# Patient Record
Sex: Male | Born: 1985 | Race: Black or African American | Marital: Married | Smoking: Never smoker
Health system: Southern US, Community
[De-identification: ages and names within clinical notes are randomized; demographics above are authoritative.]

## PROBLEM LIST (undated history)

## (undated) DIAGNOSIS — B1910 Unspecified viral hepatitis B without hepatic coma: Secondary | ICD-10-CM

## (undated) HISTORY — PX: GANGLION CYST EXCISION: SHX1691

## (undated) HISTORY — DX: Unspecified viral hepatitis B without hepatic coma: B19.10

---

## 2019-02-10 ENCOUNTER — Other Ambulatory Visit: Payer: Self-pay

## 2019-02-10 ENCOUNTER — Encounter: Payer: Self-pay | Admitting: Internal Medicine

## 2019-02-10 ENCOUNTER — Ambulatory Visit (INDEPENDENT_AMBULATORY_CARE_PROVIDER_SITE_OTHER): Admitting: Internal Medicine

## 2019-02-10 DIAGNOSIS — B181 Chronic viral hepatitis B without delta-agent: Secondary | ICD-10-CM | POA: Diagnosis not present

## 2019-02-10 DIAGNOSIS — Z227 Latent tuberculosis: Secondary | ICD-10-CM

## 2019-02-10 MED ORDER — RIFAMPIN 300 MG PO CAPS: 600.0000 mg | ORAL_CAPSULE | Freq: Every day | ORAL | 3 refills | Status: DC

## 2019-02-10 NOTE — Progress Notes (Signed)
    [facility]      Reason for Consult:chronic hepatitis B and latent Tb    Referring Physician: Dr. Melford Aase    Patient ID: Sean Brady, male    DOB: Aug 15, 1985, 33 y.o.   MRN: [id number]  HPI:   Here for evaluation of known hepatitis B and recent Quantiferon Gold test positive.   He was diagnosed with chronic hepatitis B in about 2015 after receiving the vaccination series twice without immunity.  He was then found to be surface Ag positive and had a viral load that fluctuated up over 100,000.  Since he was in [employer], he was started on Viread and took for about 1 year.  He then followed up with a Gi doctor in [place] who stopped the Viread and observed him off it.  He did well and labs reviewed show an AST of 31, ALT of 59 and a viral DNA of 9,400.  He recalls an ultrasound but no issues.  No record of a Fibroscan.  He also was recently tested for latent Tb and had a negative CXR (record not available to me at this time).  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  He has not received treatment for this.  He does have some occasional night sweats but no cough, congestion, weight loss.   Previous record reviewed with Quantiferon test and other labs, summarized above.   PMH: chronic hepatititis B, Latent Tb  Prior to Admission medications   Medication Sig Start Date End Date Taking? Authorizing Provider  rifampin (RIFADIN) 300 MG capsule Take 2 capsules (600 mg total) by mouth daily. 02/10/19   Clanton Emanuelson, Okey Regal, MD    Allergies  Allergen Reactions  . Aspirin Anaphylaxis    G6PD deficiency    Social History   Tobacco Use  . Smoking status: Never Smoker  . Smokeless tobacco: Never Used  Substance Use Topics  . Alcohol use: Not on file  . Drug use: Not on file   [facility]: unclear if mother has hepatitis B  Review of Systems  Constitutional: negative for fevers, chills, malaise, anorexia and weight loss Respiratory: negative for cough, sputum or  hemoptysis Integument/breast: negative for rash Musculoskeletal: negative for myalgias and arthralgias All other systems reviewed and are negative    Constitutional: in no apparent distress  Vitals:   02/10/19 1006  BP: 134/90  Pulse: 70  Temp: 98.6 F (37 C)   EYES: anicteric ENMT: no thrush Cardiovascular: Cor RRR Respiratory: CTA B; normal respiratory effort GI: Bowel sounds are normal, liver is not enlarged, spleen is not enlarged Musculoskeletal: no pedal edema noted Skin: negatives: no rash Neuro: non-focal  Assessment: chronic hepatitis B, immune inactive state.  I discussed the natural history of hepatitis B, indications for treatment, lack of cure.  I discussed his previous labs and treatment for him not indicated.  I also discussed the risk of [place] and future needs for screening.  Latent Tb, his risk of active Tb so will treat  Plan: 1) hepatitis B labs 2) ultrasound with elastography 3) rifampin 600 mg daily for 4 months 4) get CXR result 5) follow up in 4 weeks for latent Tb and get CMP then  Thanks for the referral

## 2019-02-14 ENCOUNTER — Ambulatory Visit (HOSPITAL_COMMUNITY)

## 2019-02-18 LAB — CBC
HCT: 44.5 % (ref 38.5–50.0)
Hemoglobin: 15 g/dL (ref 13.2–17.1)
MCH: 29.2 pg (ref 27.0–33.0)
MCHC: 33.7 g/dL (ref 32.0–36.0)
MCV: 86.7 fL (ref 80.0–100.0)
MPV: 11.3 fL (ref 7.5–12.5)
Platelets: 282 10*3/uL (ref 140–400)
RBC: 5.13 10*6/uL (ref 4.20–5.80)
RDW: 10.7 % — ABNORMAL LOW (ref 11.0–15.0)
WBC: 3.8 10*3/uL (ref 3.8–10.8)

## 2019-02-18 LAB — HEPATITIS B SURFACE ANTIGEN: Hepatitis B Surface Ag: REACTIVE — AB

## 2019-02-18 LAB — HEPATITIS DELTA ANTIBODY: Hepatitis D Ab, Total: NEGATIVE

## 2019-02-18 LAB — COMPLETE METABOLIC PANEL WITH GFR
AG Ratio: 1.5 (calc) (ref 1.0–2.5)
ALT: 39 U/L (ref 9–46)
AST: 20 U/L (ref 10–40)
Albumin: 4.4 g/dL (ref 3.6–5.1)
Alkaline phosphatase (APISO): 47 U/L (ref 36–130)
BUN: 13 mg/dL (ref 7–25)
CO2: 30 mmol/L (ref 20–32)
Calcium: 9.7 mg/dL (ref 8.6–10.3)
Chloride: 102 mmol/L (ref 98–110)
Creat: 0.95 mg/dL (ref 0.60–1.35)
GFR, Est African American: 122 mL/min/{1.73_m2} (ref >=60)
GFR, Est Non African American: 105 mL/min/{1.73_m2} (ref >=60)
Globulin: 3 g/dL (calc) (ref 1.9–3.7)
Glucose, Bld: 92 mg/dL (ref 65–99)
Potassium: 4.5 mmol/L (ref 3.5–5.3)
Sodium: 140 mmol/L (ref 135–146)
Total Bilirubin: 1 mg/dL (ref 0.2–1.2)
Total Protein: 7.4 g/dL (ref 6.1–8.1)

## 2019-02-18 LAB — HEPATITIS A ANTIBODY, TOTAL: Hepatitis A AB,Total: REACTIVE — AB

## 2019-02-18 LAB — HEPATITIS C ANTIBODY
Hepatitis C Ab: NONREACTIVE
SIGNAL TO CUT-OFF: 0.03 (ref <1.00)

## 2019-02-18 LAB — HIV ANTIBODY (ROUTINE TESTING W REFLEX): HIV 1&2 Ab, 4th Generation: NONREACTIVE

## 2019-02-18 LAB — HEPATITIS B CORE ANTIBODY, TOTAL: Hep B Core Total Ab: REACTIVE — AB

## 2019-02-18 LAB — HEPATITIS B E ANTIBODY: Hep B E Ab: REACTIVE — AB

## 2019-02-18 LAB — HEPATITIS B SURFACE ANTIBODY,QUALITATIVE: Hep B S Ab: NONREACTIVE

## 2019-02-18 LAB — HEPATITIS B DNA, ULTRAQUANTITATIVE, PCR
Hepatitis B DNA (Calc): 4.41 Log IU/mL — ABNORMAL HIGH
Hepatitis B DNA: 25600 IU/mL — ABNORMAL HIGH

## 2019-02-18 LAB — HEPATITIS B E ANTIGEN: Hep B E Ag: NONREACTIVE

## 2019-03-07 ENCOUNTER — Ambulatory Visit (HOSPITAL_COMMUNITY)

## 2019-03-09 ENCOUNTER — Ambulatory Visit: Admitting: Internal Medicine

## 2019-03-14 ENCOUNTER — Other Ambulatory Visit: Payer: Self-pay

## 2019-03-14 ENCOUNTER — Ambulatory Visit (HOSPITAL_COMMUNITY)
Admission: RE | Admit: 2019-03-14 | Discharge: 2019-03-14 | Disposition: A | Discharge status: Home / Self Care | Source: Ambulatory Visit | Attending: Internal Medicine | Admitting: Internal Medicine

## 2019-03-14 DIAGNOSIS — B181 Chronic viral hepatitis B without delta-agent: Secondary | ICD-10-CM | POA: Diagnosis present

## 2019-03-22 ENCOUNTER — Telehealth: Payer: Self-pay

## 2019-03-22 NOTE — Telephone Encounter (Signed)
COVID-19 Pre-Screening Questions:03/22/19   Do you currently have a fever (>100 F), chills or unexplained body aches?NO  Are you currently experiencing new cough, shortness of breath, sore throat, runny nose? NO  .  Have you recently travelled outside the state of [place] in the last 14 days? NO .  Have you been in contact with someone that is currently pending confirmation of Covid19 testing or has been confirmed to have the Covid19 virus?  NO  **If the patient answers NO to ALL questions -  advise the patient to please call the clinic before coming to the office should any symptoms develop.     

## 2019-03-23 ENCOUNTER — Other Ambulatory Visit: Payer: Self-pay

## 2019-03-23 ENCOUNTER — Encounter: Payer: Self-pay | Admitting: Internal Medicine

## 2019-03-23 ENCOUNTER — Ambulatory Visit (INDEPENDENT_AMBULATORY_CARE_PROVIDER_SITE_OTHER): Admitting: Internal Medicine

## 2019-03-23 VITALS — BP 144/89 | HR 75 | Temp 98.7°F | Ht 68.0 in | Wt 180.0 lb

## 2019-03-23 DIAGNOSIS — Z9189 Other specified personal risk factors, not elsewhere classified: Secondary | ICD-10-CM

## 2019-03-23 DIAGNOSIS — B181 Chronic viral hepatitis B without delta-agent: Secondary | ICD-10-CM

## 2019-03-23 DIAGNOSIS — Z227 Latent tuberculosis: Secondary | ICD-10-CM

## 2019-03-23 NOTE — Assessment & Plan Note (Addendum)
Went over the labs, ultrasound. I discussed the findings of no liver inflammation, no scaring and indications for treatment, which are none at this time.  He is E Ag negative with a DNA level that is high enough so will continue to watch every 6 months.  He has been on Viread before.   Recheck around May

## 2019-03-23 NOTE — Assessment & Plan Note (Signed)
At risk for [facility] though not really active.  Will consider an ultrasound every 6 months

## 2019-03-23 NOTE — Assessment & Plan Note (Signed)
I do feel rifampin is ok with no fibrosis and no transaminitis.  A shorter duration of rifampin is preferable.   He will start tomorrow and follow up in 3 weeks with me and will get a CMP then

## 2019-03-23 NOTE — Progress Notes (Signed)
   Subjective:    Patient ID: Sean Brady, male    DOB: 27-Nov-1985, 33 y.o.   MRN: [id number]  HPI Here for follow up of hepatitis B and latent Tb. Started on rifampin for latent Tb and tolerating.  Plan for 4 months of rifampin. Has previously been on treatment for hepatitis B but not currently.  Viral load of 25,600.  ALT wnl.  Ultrasound with elastography with no fibrosis.   Was hesitant to take rifampin after the health dept called and said with his hepatitis B, rifampin may not be good.     Review of Systems  Constitutional: Negative for fatigue and unexpected weight change.  Gastrointestinal: Negative for diarrhea and nausea.  Skin: Negative for rash.       Objective:   Physical Exam Constitutional:      Appearance: Normal appearance.  Eyes:     General: No scleral icterus. Neurological:     General: No focal deficit present.     Mental Status: He is alert.  Psychiatric:        Mood and Affect: Mood normal.   SH": no alcohol        Assessment & Plan:

## 2019-04-13 ENCOUNTER — Ambulatory Visit (INDEPENDENT_AMBULATORY_CARE_PROVIDER_SITE_OTHER): Admitting: Internal Medicine

## 2019-04-13 ENCOUNTER — Encounter: Payer: Self-pay | Admitting: Internal Medicine

## 2019-04-13 ENCOUNTER — Other Ambulatory Visit: Payer: Self-pay

## 2019-04-13 VITALS — BP 124/85 | HR 74 | Wt 182.8 lb

## 2019-04-13 DIAGNOSIS — Z227 Latent tuberculosis: Secondary | ICD-10-CM | POA: Diagnosis not present

## 2019-04-13 DIAGNOSIS — Z5181 Encounter for therapeutic drug level monitoring: Secondary | ICD-10-CM | POA: Diagnosis not present

## 2019-04-13 LAB — COMPLETE METABOLIC PANEL WITH GFR
AG Ratio: 1.6 (calc) (ref 1.0–2.5)
ALT: 24 U/L (ref 9–46)
AST: 23 U/L (ref 10–40)
Albumin: 4.1 g/dL (ref 3.6–5.1)
Alkaline phosphatase (APISO): 63 U/L (ref 36–130)
BUN: 15 mg/dL (ref 7–25)
CO2: 27 mmol/L (ref 20–32)
Calcium: 9.1 mg/dL (ref 8.6–10.3)
Chloride: 105 mmol/L (ref 98–110)
Creat: 1.03 mg/dL (ref 0.60–1.35)
GFR, Est African American: 111 mL/min/{1.73_m2} (ref >=60)
GFR, Est Non African American: 96 mL/min/{1.73_m2} (ref >=60)
Globulin: 2.5 g/dL (calc) (ref 1.9–3.7)
Glucose, Bld: 87 mg/dL (ref 65–99)
Potassium: 4.3 mmol/L (ref 3.5–5.3)
Sodium: 139 mmol/L (ref 135–146)
Total Bilirubin: 0.7 mg/dL (ref 0.2–1.2)
Total Protein: 6.6 g/dL (ref 6.1–8.1)

## 2019-04-13 NOTE — Assessment & Plan Note (Signed)
Tolerating well.  Will continue for 4 months.

## 2019-04-13 NOTE — Progress Notes (Signed)
   Subjective:    Patient ID: Sean Brady, male    DOB: 03-May-1985, 33 y.o.   MRN: [id number]  HPI Here for follow up of latent Tb Started rifampin for 4 months.  Now on his second bottle.  Now on his second bottle.  No  Issues.  No associated sob, cough.    Review of Systems  Constitutional: Negative for chills, fatigue, fever and unexpected weight change.  Respiratory: Negative for cough and shortness of breath.   Skin: Negative for rash.       Objective:   Physical Exam Constitutional:      Appearance: Normal appearance.  Cardiovascular:     Rate and Rhythm: Normal rate and regular rhythm.  Pulmonary:     Effort: Pulmonary effort is normal. No respiratory distress.     Breath sounds: Normal breath sounds.  Neurological:     General: No focal deficit present.     Mental Status: He is alert.           Assessment & Plan:

## 2019-04-13 NOTE — Assessment & Plan Note (Signed)
Will check CMP today  

## 2019-08-15 ENCOUNTER — Ambulatory Visit (INDEPENDENT_AMBULATORY_CARE_PROVIDER_SITE_OTHER): Admitting: Internal Medicine

## 2019-08-15 ENCOUNTER — Encounter: Payer: Self-pay | Admitting: Internal Medicine

## 2019-08-15 ENCOUNTER — Other Ambulatory Visit: Payer: Self-pay

## 2019-08-15 VITALS — BP 138/75 | HR 75 | Temp 98.0°F | Ht 68.0 in | Wt 176.0 lb

## 2019-08-15 DIAGNOSIS — Z227 Latent tuberculosis: Secondary | ICD-10-CM | POA: Diagnosis not present

## 2019-08-15 DIAGNOSIS — Z5181 Encounter for therapeutic drug level monitoring: Secondary | ICD-10-CM | POA: Diagnosis not present

## 2019-08-15 DIAGNOSIS — B181 Chronic viral hepatitis B without delta-agent: Secondary | ICD-10-CM | POA: Diagnosis not present

## 2019-08-15 DIAGNOSIS — Z9189 Other specified personal risk factors, not elsewhere classified: Secondary | ICD-10-CM

## 2019-08-15 NOTE — Progress Notes (Signed)
   Subjective:    Patient ID: Sean Brady, male    DOB: Oct 06, 1985, 34 y.o.   MRN: [id number]  HPI Here for follow up of latent Tb and chronic hepatitis B. He has now completed 4 months of rifampin for the latent Tb.  No significant issues.   Questions about hepatitis B and when he may have been infected.  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    Review of Systems  Constitutional: Negative for fatigue.  Gastrointestinal: Negative for diarrhea and nausea.  Skin: Negative for rash.       Objective:   Physical Exam Constitutional:      Appearance: Normal appearance.  Eyes:     General: No scleral icterus. Pulmonary:     Effort: Pulmonary effort is normal.  Neurological:     General: No focal deficit present.     Mental Status: He is alert.  Psychiatric:        Mood and Affect: Mood normal.   SH: no alcohol        Assessment & Plan:

## 2019-08-15 NOTE — Assessment & Plan Note (Signed)
No transaminitis and DNA level noted, 25,000.  Will recheck and continue to monitor twice a year.

## 2019-08-15 NOTE — Assessment & Plan Note (Signed)
Will check LFTs after completing rifampin

## 2019-08-15 NOTE — Assessment & Plan Note (Signed)
Will continue to monitor for [facility] screening with ultrasound twice a year.  Indication is black male with chronic hepatitis B.

## 2019-08-15 NOTE — Assessment & Plan Note (Signed)
Completed treatment and tolerated well.

## 2019-08-17 LAB — COMPLETE METABOLIC PANEL WITH GFR
AG Ratio: 1.7 (calc) (ref 1.0–2.5)
ALT: 38 U/L (ref 9–46)
AST: 23 U/L (ref 10–40)
Albumin: 4.5 g/dL (ref 3.6–5.1)
Alkaline phosphatase (APISO): 57 U/L (ref 36–130)
BUN: 12 mg/dL (ref 7–25)
CO2: 30 mmol/L (ref 20–32)
Calcium: 10 mg/dL (ref 8.6–10.3)
Chloride: 103 mmol/L (ref 98–110)
Creat: 1.11 mg/dL (ref 0.60–1.35)
GFR, Est African American: 101 mL/min/{1.73_m2} (ref >=60)
GFR, Est Non African American: 87 mL/min/{1.73_m2} (ref >=60)
Globulin: 2.7 g/dL (calc) (ref 1.9–3.7)
Glucose, Bld: 92 mg/dL (ref 65–99)
Potassium: 4.7 mmol/L (ref 3.5–5.3)
Sodium: 140 mmol/L (ref 135–146)
Total Bilirubin: 0.5 mg/dL (ref 0.2–1.2)
Total Protein: 7.2 g/dL (ref 6.1–8.1)

## 2019-08-17 LAB — HEPATITIS B DNA, ULTRAQUANTITATIVE, PCR
Hepatitis B DNA (Calc): 3.74 Log IU/mL — ABNORMAL HIGH
Hepatitis B DNA: 5510 IU/mL — ABNORMAL HIGH

## 2019-08-17 LAB — HEPATITIS B SURFACE ANTIGEN: Hepatitis B Surface Ag: REACTIVE — AB

## 2019-08-18 ENCOUNTER — Ambulatory Visit
Admission: RE | Admit: 2019-08-18 | Discharge: 2019-08-18 | Disposition: A | Discharge status: Home / Self Care | Source: Ambulatory Visit | Attending: Internal Medicine | Admitting: Internal Medicine

## 2019-08-18 DIAGNOSIS — Z9189 Other specified personal risk factors, not elsewhere classified: Secondary | ICD-10-CM

## 2019-08-18 DIAGNOSIS — B181 Chronic viral hepatitis B without delta-agent: Secondary | ICD-10-CM

## 2019-10-31 ENCOUNTER — Telehealth: Payer: Self-pay

## 2019-10-31 NOTE — Telephone Encounter (Signed)
Patient requesting letter stating he has been effectively treated for latent TB for [facility] school records. Requesting upload to MyChart ASAP. Advised patient that provider is working in the hospital at the moment. Patient verbalized understanding.  Maahir Horst Loyola Mast, RN

## 2019-11-01 ENCOUNTER — Encounter: Payer: Self-pay | Admitting: Internal Medicine

## 2019-11-01 NOTE — Telephone Encounter (Signed)
I uploaded it on the chart and I faxed a signed one to you if he wants to pick it up.  thanks

## 2020-02-01 ENCOUNTER — Ambulatory Visit (INDEPENDENT_AMBULATORY_CARE_PROVIDER_SITE_OTHER): Admitting: Internal Medicine

## 2020-02-01 ENCOUNTER — Other Ambulatory Visit: Payer: Self-pay

## 2020-02-01 ENCOUNTER — Encounter: Payer: Self-pay | Admitting: Internal Medicine

## 2020-02-01 VITALS — BP 115/80 | HR 99 | Temp 99.3°F | Ht 68.0 in | Wt 173.0 lb

## 2020-02-01 DIAGNOSIS — Z9189 Other specified personal risk factors, not elsewhere classified: Secondary | ICD-10-CM

## 2020-02-01 DIAGNOSIS — B181 Chronic viral hepatitis B without delta-agent: Secondary | ICD-10-CM

## 2020-02-02 ENCOUNTER — Encounter: Payer: Self-pay | Admitting: Internal Medicine

## 2020-02-02 NOTE — Assessment & Plan Note (Signed)
He continues to have no concerns on labs with no transaminitis.  His viral load is up for someone that is E Ag negative but elastography reassuring so no indication for treatment.

## 2020-02-02 NOTE — Assessment & Plan Note (Signed)
He will need continued surveillance for [facility] and will schedule for ultrasound.

## 2020-02-02 NOTE — Progress Notes (Signed)
   Subjective:    Patient ID: Sean Brady, male    DOB: 03-16-1986, 34 y.o.   MRN: [id number]  HPI Here for follow up of chronic hepatitis B. He completed 4 months of rifampin for latent Tb.  He has chronic inactive hepatitis B with E Ag negative and elastography with low risk category with kPa of 5.5.  Though his viral load has been up his AST and ALT have remained wnl even with the lower cutoff range for hepatitis B.  Last hepatitis B DNA was 5,510 IU/mL.     Review of Systems  Constitutional: Negative for fatigue.  Gastrointestinal: Negative for diarrhea and nausea.  Skin: Negative for rash.       Objective:   Physical Exam Constitutional:      Appearance: Normal appearance.  Eyes:     General: No scleral icterus. Pulmonary:     Effort: Pulmonary effort is normal.  Neurological:     General: No focal deficit present.     Mental Status: He is alert.  Psychiatric:        Mood and Affect: Mood normal.   SH: no alcohol        Assessment & Plan:

## 2020-02-03 LAB — COMPLETE METABOLIC PANEL WITH GFR
AG Ratio: 1.7 (calc) (ref 1.0–2.5)
ALT: 20 U/L (ref 9–46)
AST: 17 U/L (ref 10–40)
Albumin: 4.7 g/dL (ref 3.6–5.1)
Alkaline phosphatase (APISO): 56 U/L (ref 36–130)
BUN: 10 mg/dL (ref 7–25)
CO2: 29 mmol/L (ref 20–32)
Calcium: 9.9 mg/dL (ref 8.6–10.3)
Chloride: 103 mmol/L (ref 98–110)
Creat: 1.17 mg/dL (ref 0.60–1.35)
GFR, Est African American: 94 mL/min/{1.73_m2} (ref >=60)
GFR, Est Non African American: 81 mL/min/{1.73_m2} (ref >=60)
Globulin: 2.7 g/dL (calc) (ref 1.9–3.7)
Glucose, Bld: 90 mg/dL (ref 65–99)
Potassium: 4 mmol/L (ref 3.5–5.3)
Sodium: 141 mmol/L (ref 135–146)
Total Bilirubin: 0.8 mg/dL (ref 0.2–1.2)
Total Protein: 7.4 g/dL (ref 6.1–8.1)

## 2020-02-03 LAB — HEPATITIS B DNA, ULTRAQUANTITATIVE, PCR
Hepatitis B DNA (Calc): 3.32 Log IU/mL — ABNORMAL HIGH
Hepatitis B DNA: 2100 IU/mL — ABNORMAL HIGH

## 2020-02-08 ENCOUNTER — Ambulatory Visit
Admission: RE | Admit: 2020-02-08 | Discharge: 2020-02-08 | Disposition: A | Discharge status: Home / Self Care | Source: Ambulatory Visit | Attending: Internal Medicine | Admitting: Internal Medicine

## 2020-02-08 DIAGNOSIS — B181 Chronic viral hepatitis B without delta-agent: Secondary | ICD-10-CM

## 2020-02-14 ENCOUNTER — Ambulatory Visit: Admitting: Internal Medicine

## 2020-07-21 ENCOUNTER — Encounter (HOSPITAL_COMMUNITY): Payer: Self-pay | Admitting: Emergency Medicine

## 2020-07-21 ENCOUNTER — Other Ambulatory Visit: Payer: Self-pay

## 2020-07-21 ENCOUNTER — Emergency Department (HOSPITAL_COMMUNITY)
Admission: EM | Admit: 2020-07-21 | Discharge: 2020-07-21 | Disposition: A | Discharge status: Home / Self Care | Attending: Emergency Medicine | Admitting: Emergency Medicine

## 2020-07-21 ENCOUNTER — Emergency Department (HOSPITAL_COMMUNITY)

## 2020-07-21 DIAGNOSIS — Z859 Personal history of malignant neoplasm, unspecified: Secondary | ICD-10-CM | POA: Diagnosis not present

## 2020-07-21 DIAGNOSIS — Z20822 Contact with and (suspected) exposure to covid-19: Secondary | ICD-10-CM | POA: Insufficient documentation

## 2020-07-21 DIAGNOSIS — R0789 Other chest pain: Secondary | ICD-10-CM

## 2020-07-21 DIAGNOSIS — R079 Chest pain, unspecified: Secondary | ICD-10-CM | POA: Insufficient documentation

## 2020-07-21 DIAGNOSIS — R52 Pain, unspecified: Secondary | ICD-10-CM

## 2020-07-21 LAB — CBC WITH DIFFERENTIAL/PLATELET
Abs Immature Granulocytes: 0.01 10*3/uL (ref 0.00–0.07)
Basophils Absolute: 0 10*3/uL (ref 0.0–0.1)
Basophils Relative: 1 %
Eosinophils Absolute: 0.1 10*3/uL (ref 0.0–0.5)
Eosinophils Relative: 2 %
HCT: 45.4 % (ref 39.0–52.0)
Hemoglobin: 15.2 g/dL (ref 13.0–17.0)
Immature Granulocytes: 0 %
Lymphocytes Relative: 48 %
Lymphs Abs: 1.9 10*3/uL (ref 0.7–4.0)
MCH: 30.1 pg (ref 26.0–34.0)
MCHC: 33.5 g/dL (ref 30.0–36.0)
MCV: 89.9 fL (ref 80.0–100.0)
Monocytes Absolute: 0.5 10*3/uL (ref 0.1–1.0)
Monocytes Relative: 13 %
Neutro Abs: 1.4 10*3/uL — ABNORMAL LOW (ref 1.7–7.7)
Neutrophils Relative %: 36 %
Platelets: 286 10*3/uL (ref 150–400)
RBC: 5.05 MIL/uL (ref 4.22–5.81)
RDW: 10.7 % — ABNORMAL LOW (ref 11.5–15.5)
WBC: 3.9 10*3/uL — ABNORMAL LOW (ref 4.0–10.5)
nRBC: 0 % (ref 0.0–0.2)

## 2020-07-21 LAB — BASIC METABOLIC PANEL
Anion gap: 5 (ref 5–15)
BUN: 10 mg/dL (ref 6–20)
CO2: 30 mmol/L (ref 22–32)
Calcium: 9.6 mg/dL (ref 8.9–10.3)
Chloride: 104 mmol/L (ref 98–111)
Creatinine, Ser: 1.02 mg/dL (ref 0.61–1.24)
GFR, Estimated: >60 mL/min (ref >60)
Glucose, Bld: 84 mg/dL (ref 70–99)
Potassium: 3.9 mmol/L (ref 3.5–5.1)
Sodium: 139 mmol/L (ref 135–145)

## 2020-07-21 LAB — SARS CORONAVIRUS 2 (TAT 6-24 HRS): SARS Coronavirus 2: NEGATIVE

## 2020-07-21 LAB — TROPONIN I (HIGH SENSITIVITY): Troponin I (High Sensitivity): <2 ng/L (ref <18)

## 2020-07-21 NOTE — ED Provider Notes (Signed)
[facility] EMERGENCY DEPARTMENT Provider Note   CSN: [id number] Arrival date & time: 07/21/20  1311     History Chief Complaint  Patient presents with  . Chest Pain    Lowell Makara is a 35 y.o. male.  HPI  Patient 35 year old male with past medical history significant for hepatitis B and chronic TB followed by infectious disease.  Patient is a 35 year old male who presented today with achy chest pain that he states began 2 weeks ago when he was out running.  He states that it seems to be occurring more frequently over the past 2 weeks.  Patient describes the pain as a pressure/aching sensation.  Seems to be occurring consistently with exertion however now seems to be occurring at rest and with exertion he denies any nausea, shortness of breath, lightheadedness or dizziness no episodes of syncope or near syncope.  No significant cardiac history in his family including no history of right ventricular arrhythmogenic dysplasia, LGL, WPW, long QT, HOCM.  He states he does not use any recreational drugs denies any significant alcohol use.  No other significant associated symptoms.  He states that occasionally his symptoms of chest pain seem to be prompted by him leaning forward.  He denies any cough cold congestion fevers or chills.  No recent surgeries, hospitalization, long travel, hemoptysis, estrogen containing OCP, cancer history.  No unilateral leg swelling.  No history of PE or VTE.  HPI: A 35 year old patient presents for evaluation of chest pain. Initial onset of pain was more than 6 hours ago. The patient's chest pain is described as heaviness/pressure/tightness and is worse with exertion. The patient's chest pain is not middle- or left-sided, is not well-localized, is not sharp and does not radiate to the arms/jaw/neck. The patient does not complain of nausea and denies diaphoresis. The patient has no history of stroke, has no history of peripheral artery disease,  has not smoked in the past 90 days, denies any history of treated diabetes, has no relevant family history of coronary artery disease (first degree relative at less than age 23), is not hypertensive, has no history of hypercholesterolemia and does not have an elevated BMI (>=30).   History reviewed. No pertinent past medical history.  Patient Active Problem List   Diagnosis Date Noted  . At risk for cancer 03/23/2019  . Chronic viral hepatitis B without delta-agent (HCC) 02/10/2019  . TB lung, latent 02/10/2019    History reviewed. No pertinent surgical history.     No family history on file.  Social History   Tobacco Use  . Smoking status: Never Smoker  . Smokeless tobacco: Never Used  Substance Use Topics  . Alcohol use: Not Currently  . Drug use: Never    Home Medications Prior to Admission medications   Not on File    Allergies    Aspirin  Review of Systems   Review of Systems  Constitutional: Negative for chills and fever.  HENT: Negative for congestion.   Eyes: Negative for pain.  Respiratory: Negative for cough and shortness of breath.   Cardiovascular: Positive for chest pain. Negative for leg swelling.  Gastrointestinal: Negative for abdominal pain, diarrhea, nausea and vomiting.  Genitourinary: Negative for dysuria.  Musculoskeletal: Negative for myalgias.  Skin: Negative for rash.  Neurological: Negative for dizziness and headaches.    Physical Exam Updated Vital Signs BP (!) 136/97 (BP Location: Left Arm)   Pulse 63   Temp 98.5 F (36.9 C)   Resp 18  Ht 5\' 8"  (1.727 m)   Wt 78.5 kg   SpO2 100%   BMI 26.30 kg/m   Physical Exam Vitals and nursing note reviewed.  Constitutional:      General: He is not in acute distress.    Appearance: He is not ill-appearing.     Comments: Pleasant well-appearing 35 year old.  In no acute distress.  Sitting comfortably in bed.  Able answer questions appropriately follow commands. No increased work of  breathing. Speaking in full sentences.  HENT:     Head: Normocephalic and atraumatic.     Nose: Nose normal.  Eyes:     General: No scleral icterus. Cardiovascular:     Rate and Rhythm: Normal rate and regular rhythm.     Pulses: Normal pulses.     Heart sounds: Normal heart sounds.     Comments: Bilateral radial artery pulses 3+ and symmetric Pulmonary:     Effort: Pulmonary effort is normal. No respiratory distress.     Breath sounds: No wheezing.  Abdominal:     Palpations: Abdomen is soft.     Tenderness: There is no abdominal tenderness. There is no guarding or rebound.  Musculoskeletal:     Cervical back: Normal range of motion.     Right lower leg: No edema.     Left lower leg: No edema.  Skin:    General: Skin is warm and dry.     Capillary Refill: Capillary refill takes less than 2 seconds.  Neurological:     Mental Status: He is alert. Mental status is at baseline.  Psychiatric:        Mood and Affect: Mood normal.        Behavior: Behavior normal.     ED Results / Procedures / Treatments   Labs (all labs ordered are listed, but only abnormal results are displayed) Labs Reviewed  CBC WITH DIFFERENTIAL/PLATELET - Abnormal; Notable for the following components:      Result Value   WBC 3.9 (*)    RDW 10.7 (*)    Neutro Abs 1.4 (*)    All other components within normal limits  SARS CORONAVIRUS 2 (TAT 6-24 HRS)  BASIC METABOLIC PANEL  TROPONIN I (HIGH SENSITIVITY)    EKG EKG Interpretation  Date/Time:  Saturday July 21 2020 13:27:40 EDT Ventricular Rate:  68 PR Interval:  174 QRS Duration: 94 QT Interval:  398 QTC Calculation: 423 R Axis:   70 Text Interpretation: Normal sinus rhythm Nonspecific T wave abnormality Abnormal ECG Confirmed by 03-15-1993 ([id number]) on 07/21/2020 5:56:24 PM   Radiology No results found.  Procedures Procedures   Medications Ordered in ED Medications - No data to display  ED Course  I have reviewed the triage vital  signs and the nursing notes.  Pertinent labs & imaging results that were available during my care of the patient were reviewed by me and considered in my medical decision making (see chart for details).  Clinical Course as of 07/25/20 1525  Sat Jul 21, 2020  1810 Discussed with [facility] cardiology attending physician on call who agrees follow up OP is reasonable.  [WF]    Clinical Course User Index [WF] [facility] [place]   MDM Rules/Calculators/A&P HEAR Score: 1                        Patient is PERC -[age]-year-old male presented today with chest pain that seems to be occasionally exertional nonpleuritic.  He has  vital signs within normal limits.  No history of LGL, WPW, HOCM, no family history of arrhythmogenic right ventricular dysplasia, no cough, congestion, sore throat, other viral symptoms indicative of pericarditis and no indication at this time EKG.  Troponin x1 within normal limits/undetectable.  BMP unremarkable.  CBC with mild leukopenia of this is unchanged from prior.  Chest x-ray unremarkable.  EKG without any significant abnormality.  Discussed with cardiology on-call who is also reassured about patient and agrees with my plan to discharge with follow-up with cardiology.  Patient has been monitored here in ER on telemetry.  No abnormalities on my review.  Discharged with recommendations today Tylenol in case this is perhaps musculoskeletal.  Strict return precautions given.  Final Clinical Impression(s) / ED Diagnoses Final diagnoses:  Atypical chest pain    Rx / DC Orders ED Discharge Orders         Ordered    Ambulatory referral to Cardiology        07/21/20 1813           Gailen Shelter, [place] 07/25/20 1527    Cheryll Cockayne, MD 08/03/20 1346

## 2020-07-21 NOTE — ED Triage Notes (Signed)
C/o intermittent L sided chest pain that started 2 weeks ago when running.  States when he stopped running the pain stopped.  Also reports chest pain with intercourse and pain with any exertion.  Denies SOB, nausea, vomiting, and dizziness.  Reports intermittent R calf pain.

## 2020-07-21 NOTE — ED Provider Notes (Signed)
Patient placed in Quick Look pathway, seen and evaluated   Chief Complaint: chest pain  HPI:   35 y/o M who presents to the ED today for eval of chest pain that started intermittently for a few weeks. States that the chest pain occurs at rest and with exertion but for the last 2 weeks sxs have been present when he exerts himself. Denies associated sob, nausea, vomiting, diaphoresis.  Denies dx of htn, hld, dm. Denies tobacco use. Denies early fam hx of heart disease.   Denies hemoptysis, recent surgery/trauma, recent long travel, hormone use, personal hx of cancer, or hx of DVT/PE.    ROS: chest pain (one)  Physical Exam:   Gen: No distress  Neuro: Awake and Alert  Skin: Warm    Focused Exam: RRR, lungs CTAB. No calf TTP or edema bilat.    Initiation of care has begun. The patient has been counseled on the process, plan, and necessity for staying for the completion/evaluation, and the remainder of the medical screening examination  MSE was initiated and I personally evaluated the patient and placed orders (if any) at  1:23 PM on July 21, 2020.  The patient appears stable so that the remainder of the MSE may be completed by another provider.    Rayne Du 07/21/20 1323    Gwyneth Sprout, MD 07/21/20 2127

## 2020-07-21 NOTE — ED Notes (Signed)
Pt ambulated in room with steady gait. Pt SpO2 maintained at 99% RA

## 2020-07-21 NOTE — Discharge Instructions (Signed)
Please follow-up with cardiology.  You may return to the ER if you have any new, worsening or concerning symptoms.  Specifically if you begin having any shortness of breath, coughing up blood, fevers, episodes of passing out or any other new or concerning symptoms.  Drink plenty of water and stay active as we discussed.  He may use Tylenol and ibuprofen as discussed below.  Please use Tylenol or ibuprofen for pain.  You may use 600 mg ibuprofen every 6 hours or 1000 mg of Tylenol every 6 hours.  You may choose to alternate between the 2.  This would be most effective.  Not to exceed 4 g of Tylenol within 24 hours.  Not to exceed 3200 mg ibuprofen 24 hours.

## 2020-08-01 ENCOUNTER — Ambulatory Visit: Admitting: Internal Medicine

## 2020-08-09 NOTE — Progress Notes (Deleted)
    Finis Bud, MD Reason for referral-chest pain  HPI: 35 year old male for evaluation of chest pain at request of Antony Haste, MD.  Patient seen with chest pain April 2022.  Chest x-ray with no acute disease.  Hemoglobin 15.2.  Troponin normal.  No current outpatient medications on file.   No current facility-administered medications for this visit.    Allergies  Allergen Reactions  . Aspirin Anaphylaxis    G6PD deficiency    No past medical history on file.  No past surgical history on file.  Social History   Socioeconomic History  . Marital status: Married    Spouse name: Not on file  . Number of children: Not on file  . Years of education: Not on file  . Highest education level: Not on file  Occupational History  . Not on file  Tobacco Use  . Smoking status: Never Smoker  . Smokeless tobacco: Never Used  Substance and Sexual Activity  . Alcohol use: Not Currently  . Drug use: Never  . Sexual activity: Not on file  Other Topics Concern  . Not on file  Social History Narrative  . Not on file   Social Determinants of Health   Financial Resource Strain: Not on file  Food Insecurity: Not on file  Transportation Needs: Not on file  Physical Activity: Not on file  Stress: Not on file  Social Connections: Not on file  Intimate Partner Violence: Not on file    No family history on file.  ROS: no fevers or chills, productive cough, hemoptysis, dysphasia, odynophagia, melena, hematochezia, dysuria, hematuria, rash, seizure activity, orthopnea, PND, pedal edema, claudication. Remaining systems are negative.  Physical Exam:   There were no vitals taken for this visit.  General:  Well developed/well nourished in NAD Skin warm/dry Patient not depressed No peripheral clubbing Back-normal HEENT-normal/normal eyelids Neck supple/normal carotid upstroke bilaterally; no bruits; no JVD; no thyromegaly chest - CTA/ normal expansion CV -  RRR/normal S1 and S2; no murmurs, rubs or gallops;  PMI nondisplaced Abdomen -NT/ND, no HSM, no mass, + bowel sounds, no bruit 2+ femoral pulses, no bruits Ext-no edema, chords, 2+ DP Neuro-grossly nonfocal  ECG -July 21, 2020-sinus rhythm with nonspecific ST changes.  Personally reviewed  A/P  1 chest pain-  Olga Millers, MD

## 2020-08-16 ENCOUNTER — Other Ambulatory Visit: Payer: Self-pay

## 2020-08-16 ENCOUNTER — Encounter: Payer: Self-pay | Admitting: Internal Medicine

## 2020-08-16 ENCOUNTER — Ambulatory Visit (INDEPENDENT_AMBULATORY_CARE_PROVIDER_SITE_OTHER): Admitting: Internal Medicine

## 2020-08-16 VITALS — BP 132/85 | HR 75 | Ht 68.0 in | Wt 171.8 lb

## 2020-08-16 DIAGNOSIS — B181 Chronic viral hepatitis B without delta-agent: Secondary | ICD-10-CM | POA: Diagnosis not present

## 2020-08-16 DIAGNOSIS — Z9189 Other specified personal risk factors, not elsewhere classified: Secondary | ICD-10-CM | POA: Diagnosis not present

## 2020-08-16 NOTE — Progress Notes (Signed)
   Subjective:    Patient ID: Sean Brady, male    DOB: December 08, 1985, 35 y.o.   MRN: [id number]  HPI Here for follow up of chronic hepatitis B He has chronic inactive hepatitis B, E Ag negative with no fibrosis on elastography noted 02/2019 followed every 6 months. Last viral load 2,100 and AST/ALT 17/20.  Treatment has not been indicated.   He is undergoing HCC screening every 6 months with his risk factors of black race.     Review of Systems  Constitutional: Negative for fatigue.  Gastrointestinal: Negative for diarrhea and nausea.  Skin: Negative for rash.       Objective:   Physical Exam Eyes:     General: No scleral icterus. Pulmonary:     Effort: Pulmonary effort is normal.  Neurological:     General: No focal deficit present.     Mental Status: He is alert.  Psychiatric:        Mood and Affect: Mood normal.           Assessment & Plan:

## 2020-08-16 NOTE — Assessment & Plan Note (Signed)
Will check his labs today to be sure no concerns and continue to observe off of treatment.  rtc in 6 months

## 2020-08-16 NOTE — Assessment & Plan Note (Signed)
Will continue HCC screening with ultrasounds

## 2020-08-17 ENCOUNTER — Ambulatory Visit: Admitting: Cardiology

## 2020-08-20 ENCOUNTER — Other Ambulatory Visit: Payer: Self-pay

## 2020-08-20 ENCOUNTER — Ambulatory Visit (INDEPENDENT_AMBULATORY_CARE_PROVIDER_SITE_OTHER): Admitting: Cardiovascular Disease

## 2020-08-20 ENCOUNTER — Encounter: Payer: Self-pay | Admitting: Cardiovascular Disease

## 2020-08-20 VITALS — BP 132/84 | HR 68 | Ht 68.0 in | Wt 171.0 lb

## 2020-08-20 DIAGNOSIS — R072 Precordial pain: Secondary | ICD-10-CM

## 2020-08-20 NOTE — Progress Notes (Signed)
Chief Complaint  Patient presents with  . 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 He is from [place]. He was seen in the ED at [facility] 07/21/20 with chest pain which was felt to be atypical and was discharged home. Troponin was negative. EKG from 07/21/20 reviewed and shows sinus with non-specific T wave abnormalities.   He tells me that he has been having resting and exertional chest pain for one month. None over the last week but has not been running. He had onset of chest pain one month ago with exercise. No associated dyspnea. No weights changes. No dizziness. Pain resolved with rest. No prior heart disease. He has never smoked. 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   Primary Care Physician: Eartha Inch, MD   Past Medical History:  Diagnosis Date  . Hepatitis B    Past Surgical History:  Procedure Laterality Date  . GANGLION CYST EXCISION      No current outpatient medications on file.   No current facility-administered medications for this visit.    Allergies  Allergen Reactions  . Aspirin Anaphylaxis    G6PD deficiency    Social History   Socioeconomic History  . Marital status: Married    Spouse name: Not on file  . Number of children: 0  . Years of education: Not on file  . Highest education level: Not on file  Occupational History  . Occupation: Theatre stage manager at [employer]  . Smoking status: Never Smoker  . Smokeless tobacco: Never Used  Substance and Sexual Activity  . Alcohol use: Not Currently  . Drug use: Never  . Sexual activity: Not on file  Other Topics Concern  . Not on file  Social History Narrative  . Not on file   Social Determinants of Health   Financial Resource Strain:  Not on file  Food Insecurity: Not on file  Transportation Needs: Not on file  Physical Activity: Not on file  Stress: Not on file  Social Connections: Not on file  Intimate Partner Violence: Not on file    Family History  Problem Relation Age of Onset  . Hypertension Mother     Review of Systems:  As stated in the HPI and otherwise negative.   BP 132/84   Pulse 68   Ht 5\' 8"  (1.727 m)   Wt 171 lb (77.6 kg)   SpO2 99%   BMI 26.00 kg/m   Physical Examination: General: Well developed, well nourished, NAD  HEENT: OP clear, mucus membranes moist  SKIN: warm, dry. No rashes. Neuro: No focal deficits  Musculoskeletal: Muscle strength 5/5 all ext  Psychiatric: Mood and affect normal  Neck: No JVD, no carotid bruits, no thyromegaly, no lymphadenopathy.  Lungs:Clear bilaterally, no wheezes, rhonci, crackles Cardiovascular: Regular rate and rhythm. No murmurs, gallops or rubs. Abdomen:Soft. Bowel sounds present. Non-tender.  Extremities: No lower extremity edema. Pulses are 2 + in the bilateral DP/PT.  EKG:  EKG is ordered today. The ekg ordered today demonstrates sinus, Non-specific T wave abnormality  Recent Labs: 07/21/2020: Hemoglobin 15.2; Platelets 286 08/16/2020: ALT 21; BUN 9; Creat 0.99; Potassium 4.5; Sodium 140   Lipid Panel No results found for: CHOL,  TRIG, HDL, CHOLHDL, VLDL, LDLCALC, LDLDIRECT   Wt Readings from Last 3 Encounters:  08/20/20 171 lb (77.6 kg)  08/16/20 171 lb 12.8 oz (77.9 kg)  07/21/20 173 lb (78.5 kg)      Assessment and Plan:   1. Atypical chest pain: Will arrange an echo to exclude structural heart disease and an exercise stress test to exclude ischemia.   Stress test reviewed and informed consent placed in chart.   Current medicines are reviewed at length with the patient today.  The patient does not have concerns regarding medicines.  The following changes have been made:  no change  Labs/ tests ordered today include:   Orders  Placed This Encounter  Procedures  . Exercise Tolerance Test  . EKG 12-Lead  . ECHOCARDIOGRAM COMPLETE     Disposition:   F/U with me prn.    Signed, Verne Carrow, MD 08/20/2020 10:08 AM    [facility] [address] [place], [place], [place]  [address] Phone: [phone]; Fax: [phone]

## 2020-08-20 NOTE — Patient Instructions (Signed)
Medication Instructions:  Your physician recommends that you continue on your current medications as directed. Please refer to the Current Medication list given to you today.  *If you need a refill on your cardiac medications before your next appointment, please call your pharmacy*   Lab Work: None If you have labs (blood work) drawn today and your tests are completely normal, you will receive your results only by: Marland Kitchen MyChart Message (if you have MyChart) OR . A paper copy in the mail If you have any lab test that is abnormal or we need to change your treatment, we will call you to review the results.   Testing/Procedures: Your physician has requested that you have an echocardiogram. Echocardiography is a painless test that uses sound waves to create images of your heart. It provides your doctor with information about the size and shape of your heart and how well your heart's chambers and valves are working. This procedure takes approximately one hour. There are no restrictions for this procedure.  Your physician has requested that you have an exercise tolerance test. For further information please visit [email or web address] Please also follow instruction sheet, as given.     Follow-Up: At [facility], you and your health needs are our priority.  As part of our continuing mission to provide you with exceptional heart care, we have created designated Provider Care Teams.  These Care Teams include your primary Cardiologist (physician) and Advanced Practice Providers (APPs -  Physician Assistants and Nurse Practitioners) who all work together to provide you with the care you need, when you need it.  We recommend signing up for the patient portal called "MyChart".  Sign up information is provided on this After Visit Summary.  MyChart is used to connect with patients for Virtual Visits (Telemedicine).  Patients are able to view lab/test results, encounter notes, upcoming appointments, etc.   Non-urgent messages can be sent to your provider as well.   To learn more about what you can do with MyChart, go to [email or web address].    Your next appointment:   As needed    The format for your next appointment:   In Person  Provider:   You may see Dr. Verne Carrow or one of the following Advanced Practice Providers on your designated Care Team:    Ronie Spies, PA-C  Jacolyn Reedy, PA-C    Other Instructions

## 2020-08-21 LAB — COMPLETE METABOLIC PANEL WITH GFR
AG Ratio: 1.7 (calc) (ref 1.0–2.5)
ALT: 21 U/L (ref 9–46)
AST: 17 U/L (ref 10–40)
Albumin: 4.6 g/dL (ref 3.6–5.1)
Alkaline phosphatase (APISO): 58 U/L (ref 36–130)
BUN: 9 mg/dL (ref 7–25)
CO2: 31 mmol/L (ref 20–32)
Calcium: 9.7 mg/dL (ref 8.6–10.3)
Chloride: 104 mmol/L (ref 98–110)
Creat: 0.99 mg/dL (ref 0.60–1.35)
GFR, Est African American: 116 mL/min/{1.73_m2} (ref >=60)
GFR, Est Non African American: 100 mL/min/{1.73_m2} (ref >=60)
Globulin: 2.7 g/dL (calc) (ref 1.9–3.7)
Glucose, Bld: 92 mg/dL (ref 65–99)
Potassium: 4.5 mmol/L (ref 3.5–5.3)
Sodium: 140 mmol/L (ref 135–146)
Total Bilirubin: 1 mg/dL (ref 0.2–1.2)
Total Protein: 7.3 g/dL (ref 6.1–8.1)

## 2020-08-21 LAB — HIV ANTIBODY (ROUTINE TESTING W REFLEX): HIV 1&2 Ab, 4th Generation: NONREACTIVE

## 2020-08-21 LAB — HEPATITIS B DNA, ULTRAQUANTITATIVE, PCR
Hepatitis B DNA (Calc): 3.33 Log IU/mL — ABNORMAL HIGH
Hepatitis B DNA: 2140 IU/mL — ABNORMAL HIGH

## 2020-08-30 ENCOUNTER — Ambulatory Visit (INDEPENDENT_AMBULATORY_CARE_PROVIDER_SITE_OTHER)

## 2020-08-30 ENCOUNTER — Other Ambulatory Visit: Payer: Self-pay

## 2020-08-30 ENCOUNTER — Ambulatory Visit
Admission: RE | Admit: 2020-08-30 | Discharge: 2020-08-30 | Disposition: A | Discharge status: Home / Self Care | Source: Ambulatory Visit | Attending: Internal Medicine | Admitting: Internal Medicine

## 2020-08-30 DIAGNOSIS — R072 Precordial pain: Secondary | ICD-10-CM | POA: Diagnosis not present

## 2020-08-30 DIAGNOSIS — Z9189 Other specified personal risk factors, not elsewhere classified: Secondary | ICD-10-CM

## 2020-08-30 DIAGNOSIS — B181 Chronic viral hepatitis B without delta-agent: Secondary | ICD-10-CM

## 2020-08-30 LAB — EXERCISE TOLERANCE TEST
Estimated workload: 16.4 METS
Exercise duration (min): 13 min
Exercise duration (sec): 40 s
MPHR: 187 {beats}/min
Peak HR: 176 {beats}/min
Percent HR: 94 %
RPE: 15
Rest HR: 75 {beats}/min

## 2020-08-31 ENCOUNTER — Telehealth: Payer: Self-pay | Admitting: Cardiovascular Disease

## 2020-08-31 NOTE — Telephone Encounter (Signed)
Attempted call to pt.  Left voicemail message to contact office at [phone] and will also release results to his MyChart.

## 2020-08-31 NOTE — Telephone Encounter (Signed)
Pt states he is returning a call for his ETT results. No documentation of who called. Please advise.

## 2020-09-13 ENCOUNTER — Ambulatory Visit (HOSPITAL_COMMUNITY): Attending: Cardiology

## 2020-09-13 ENCOUNTER — Other Ambulatory Visit: Payer: Self-pay

## 2020-09-13 DIAGNOSIS — R072 Precordial pain: Secondary | ICD-10-CM | POA: Insufficient documentation

## 2020-09-13 LAB — ECHOCARDIOGRAM COMPLETE
Area-P 1/2: 4.6 cm2
S' Lateral: 3 cm

## 2020-09-19 ENCOUNTER — Encounter: Payer: Self-pay | Admitting: *Deleted

## 2021-05-16 IMAGING — US US ABDOMEN LIMITED W/ ELASTOGRAPHY
2 series · 12 of 25 positions shown · non-contrast
Comparison: None.

CLINICAL DATA: Chronic viral hepatitis-B

EXAM:
US ABDOMEN LIMITED - RIGHT UPPER QUADRANT
ULTRASOUND HEPATIC ELASTOGRAPHY
TECHNIQUE: Sonography of the right upper quadrant was performed. In addition,
ultrasound elastography evaluation of the liver was performed. A
region of interest was placed within the right lobe of the liver.
Following application of a compressive sonographic pulse, tissue
compressibility was assessed. Multiple assessments were performed at
the selected site. Median tissue compressibility was determined.
Previously, hepatic stiffness was assessed by shear wave velocity.
Based on recently published Society of Radiologists in Ultrasound
consensus article, reporting is now recommended to be performed in
the SI units of pressure (kiloPascals) representing hepatic
stiffness/elasticity. The obtained result is compared to the
published reference standards. (cACLD= compensated Advanced Chronic
Liver Disease)

[Series 1: us abdomen limited w/ elastography · 9 of 35 slices shown (1 of 2)]
[im 3/35]
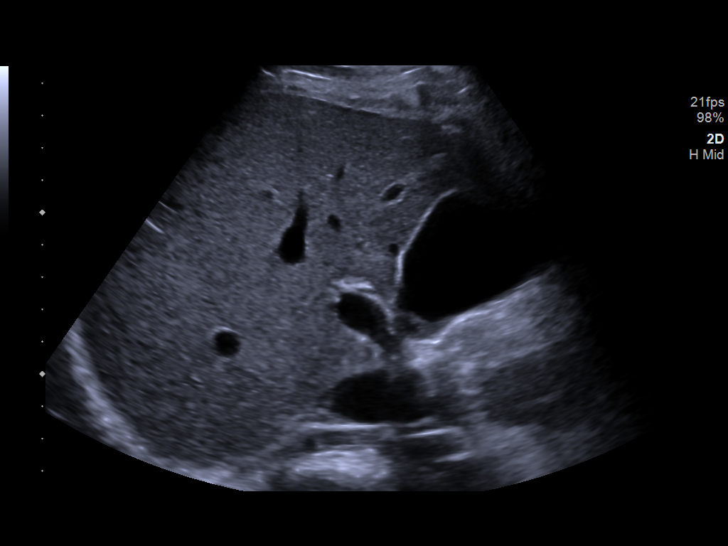
[im 7/35]
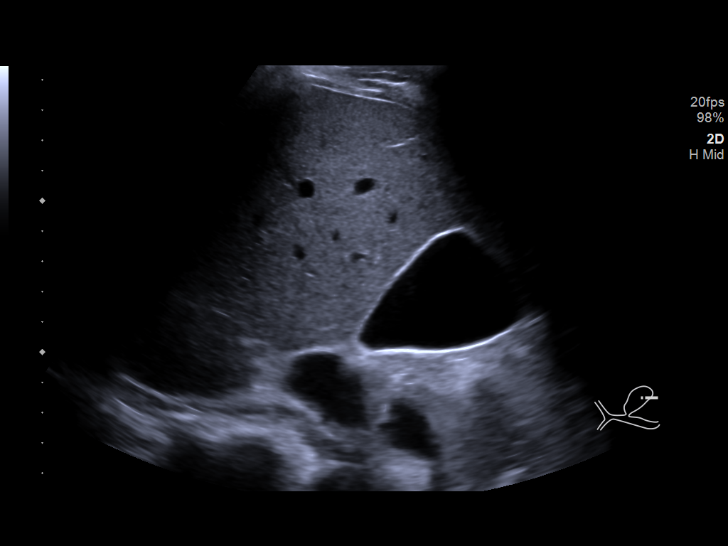
[im 11/35]
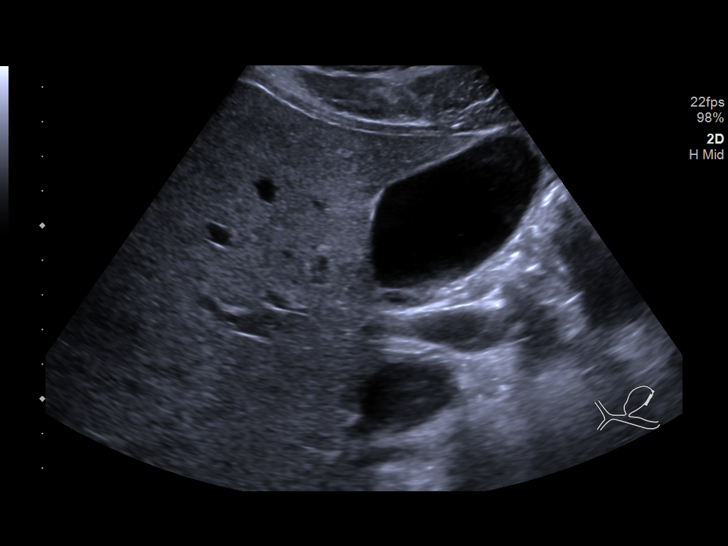
[im 15/35]
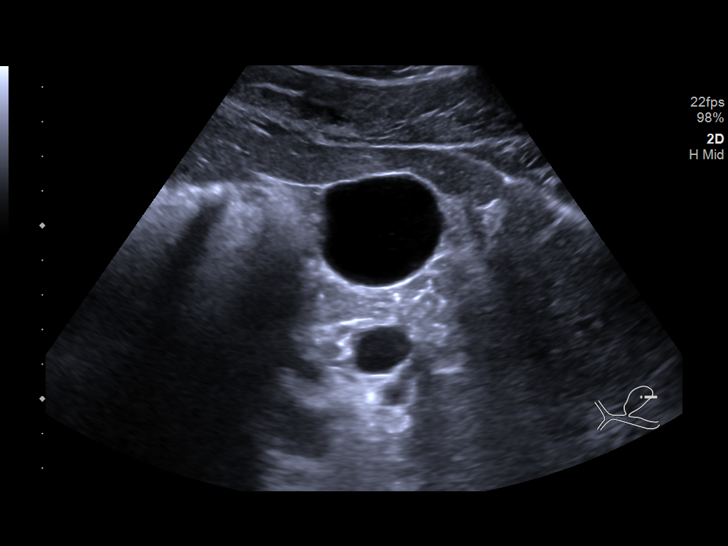
[im 19/35]
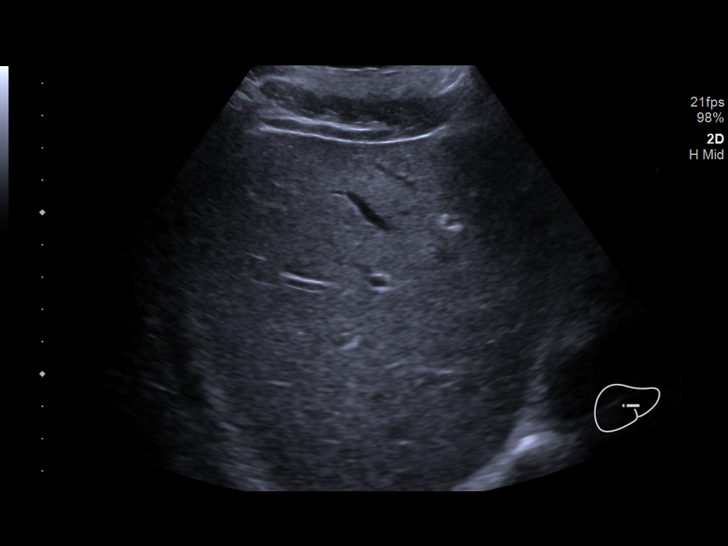
[im 23/35]
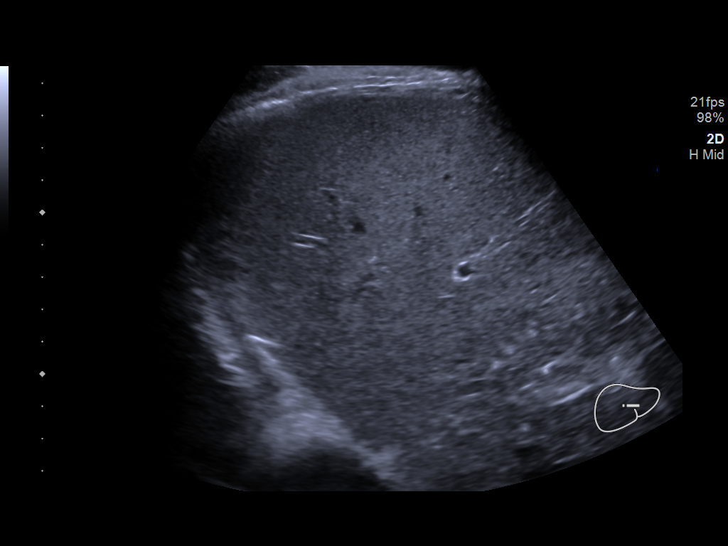
[im 27/35]
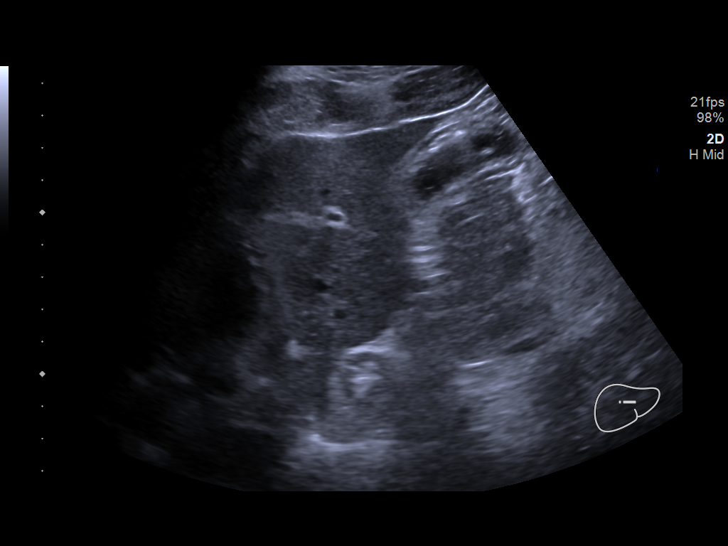
[im 31/35]
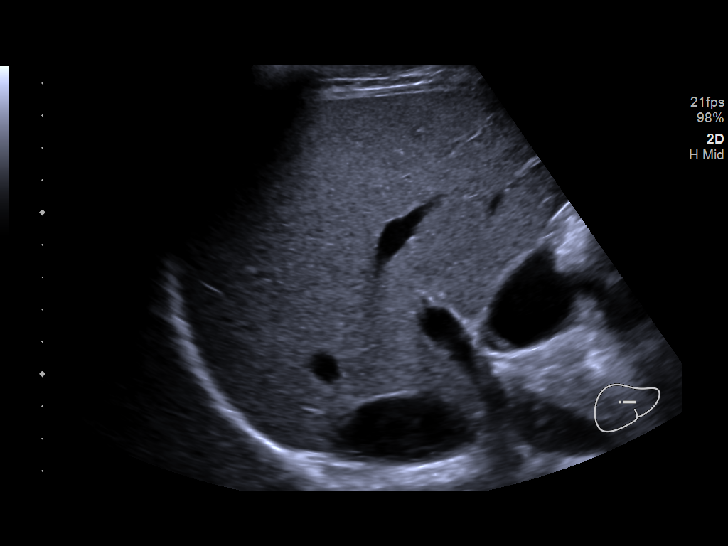
[im 35/35]
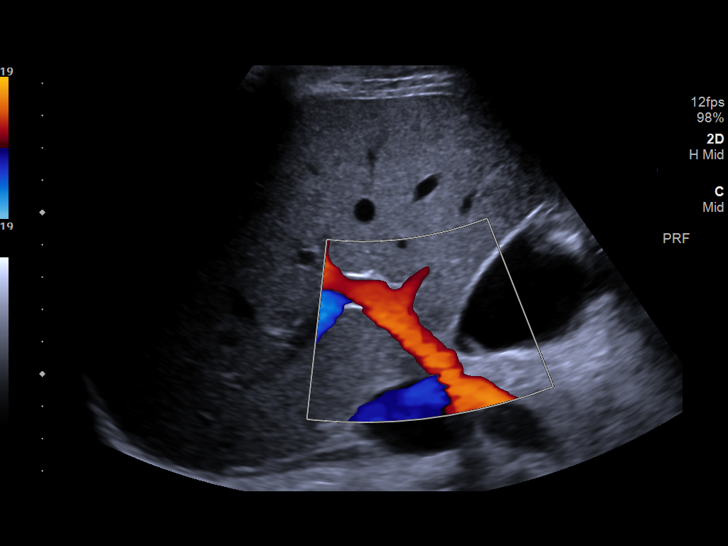

[Series 2: us abdomen limited w/ elastography · 3 of 13 slices shown (2 of 2)]
[im 3/13]
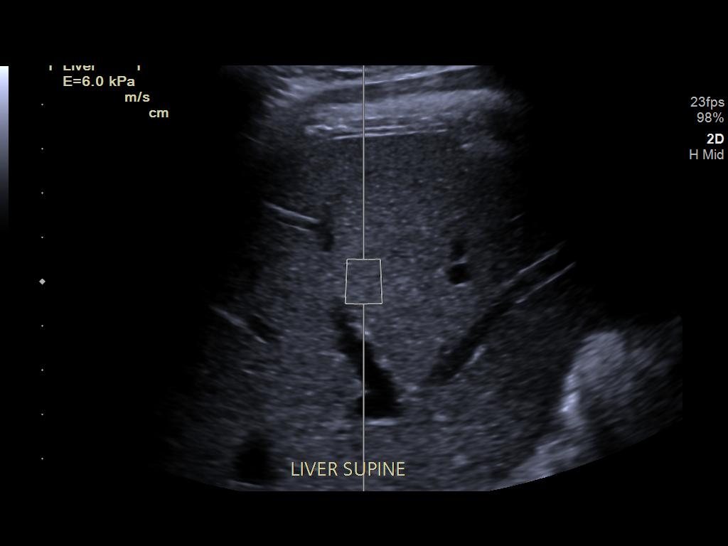
[im 7/13]
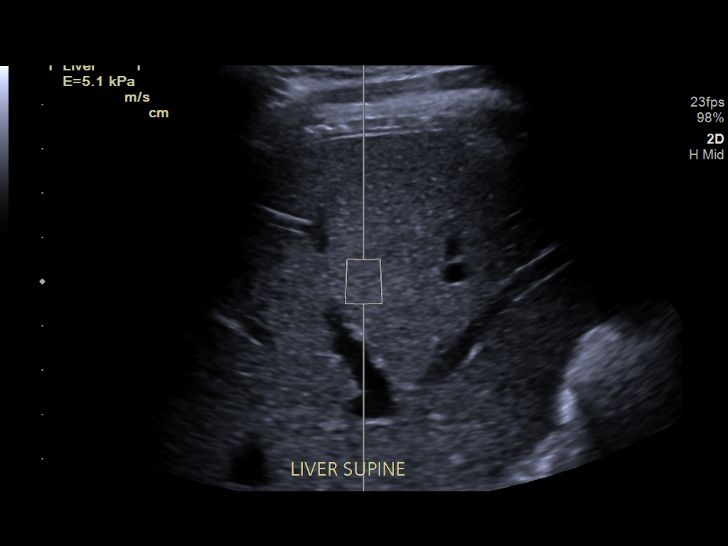
[im 11/13]
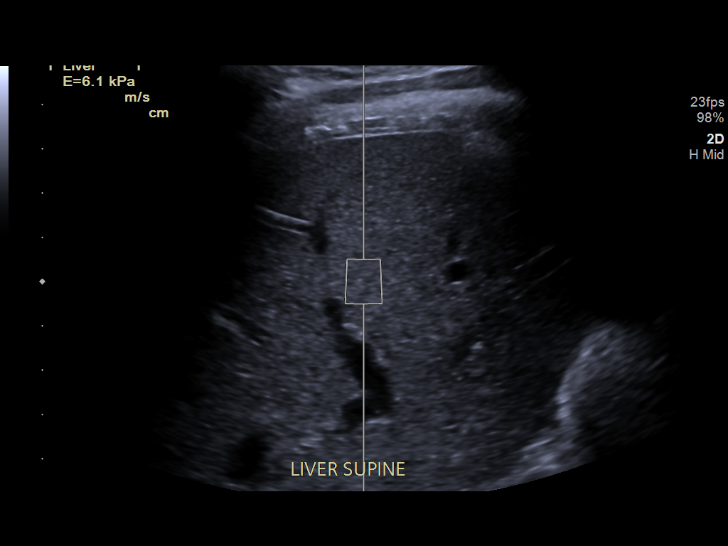

[12 of 25 positions shown; findings below may reference images not displayed]

FINDINGS: ULTRASOUND ABDOMEN LIMITED RIGHT UPPER QUADRANT

Gallbladder:

No gallstones, gallbladder wall thickening, or pericholecystic
fluid. Negative sonographic Murphy's sign.

Common bile duct:

Diameter: 3 mm

Liver:

No focal lesion identified. Within normal limits in parenchymal
echogenicity. Portal vein is patent on color Doppler imaging with
normal direction of blood flow towards the liver.

ULTRASOUND HEPATIC ELASTOGRAPHY

Device: Siemens Helix VTQ

Patient position: Supine

Transducer [id number]

Number of measurements: 10

Hepatic segment:  8

Median kPa:

IQR:

IQR/Median kPa ratio:

Data quality:  Good

Diagnostic category: ?9 kPa: in the absence of other known clinical
signs, rules out cACLD
IMPRESSION: ULTRASOUND RUQ:

Negative right upper quadrant ultrasound.

ULTRASOUND HEPATIC ELASTOGRAPHY:

Median kPa:

Diagnostic category: ?9 kPa: in the absence of other known clinical
signs, rules out cACLD

The use of hepatic elastography is applicable to patients with viral
hepatitis and non-alcoholic fatty liver disease. At this time, there
is insufficient data for the referenced cut-off values and use in
other causes of liver disease, including alcoholic liver disease.
Patients, however, may be assessed by elastography and serve as
their own reference standard/baseline.

In patients with non-alcoholic liver disease, the values suggesting
compensated advanced chronic liver disease (cACLD) may be lower, and
patients may need additional testing with elasticity results of [DATE]
kPa.

Please note that abnormal hepatic elasticity and shear wave
velocities may also be identified in clinical settings other than
with hepatic fibrosis, such as: acute hepatitis, elevated right
heart and central venous pressures including use of beta blockers,
[place] disease (Sang), infiltrative processes such as
mastocytosis/amyloidosis/infiltrative tumor/lymphoma, extrahepatic
cholestasis, with hyperemia in the post-prandial state, and with
liver transplantation. Correlation with patient history, laboratory
data, and clinical condition recommended.

## 2021-10-20 IMAGING — US US ABDOMEN LIMITED
1 series · 14 of 25 positions shown · non-contrast
Comparison: 03/14/2019

CLINICAL DATA: Chronic viral hepatitis B without delta agent,
hepatocellular carcinoma screening

EXAM:
ULTRASOUND ABDOMEN LIMITED RIGHT UPPER QUADRANT

[Series 1: us abdomen limited · 0.19mm/px · 14 of 34 slices shown]
[im 1/34]
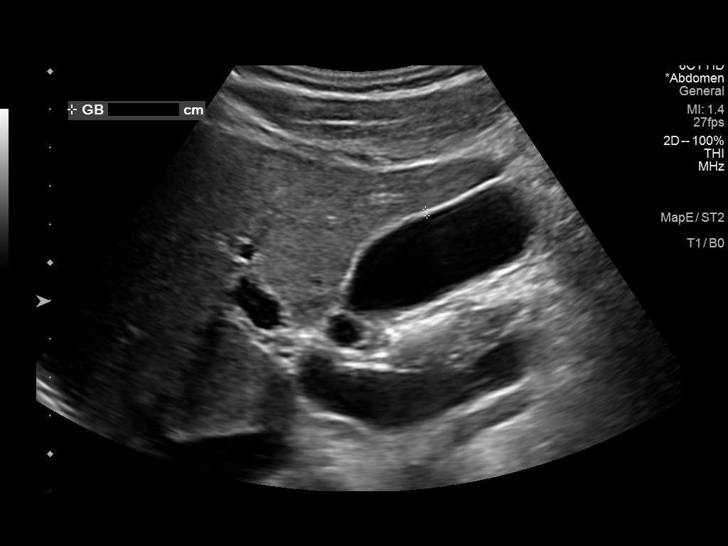
[im 3/34]
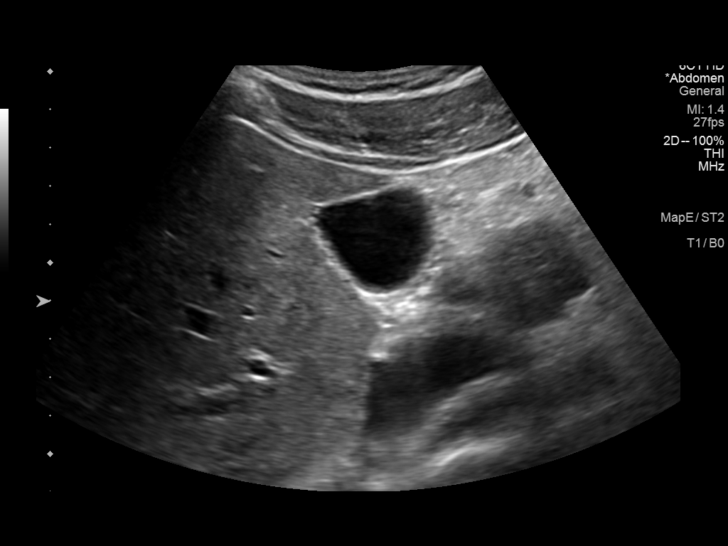
[im 6/34]
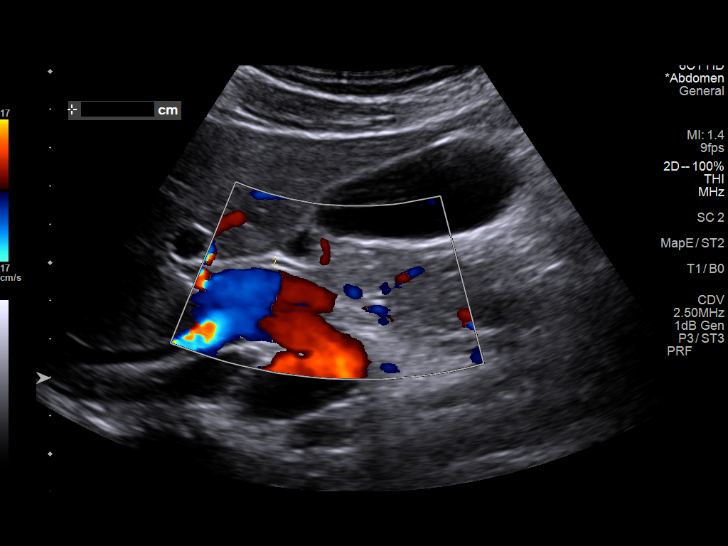
[im 9/34]
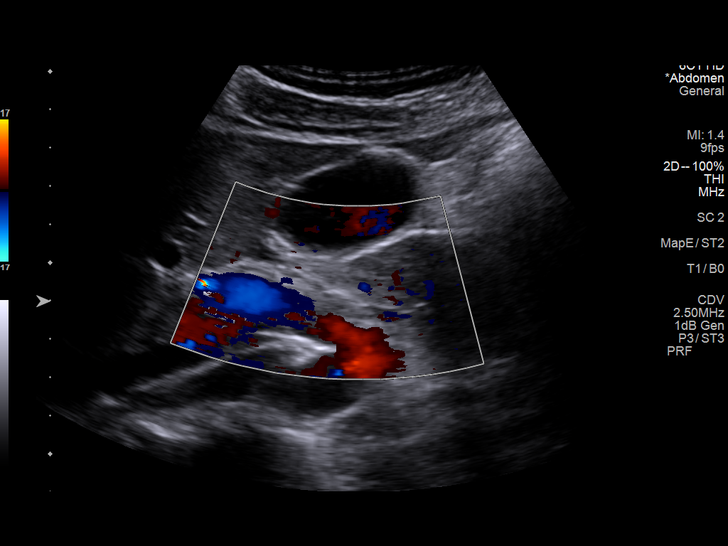
[im 12/34]
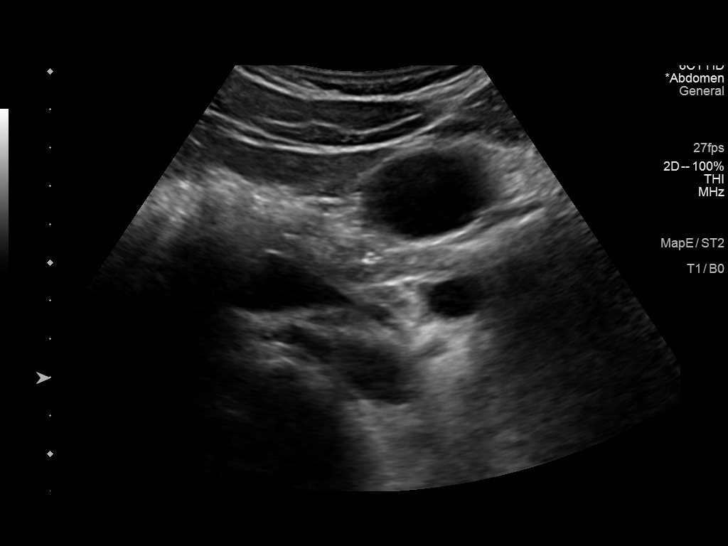
[im 13/34]
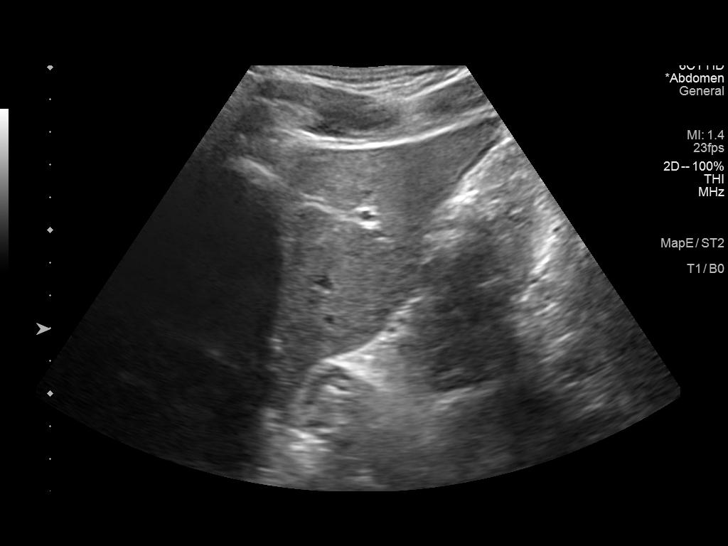
[im 16/34]
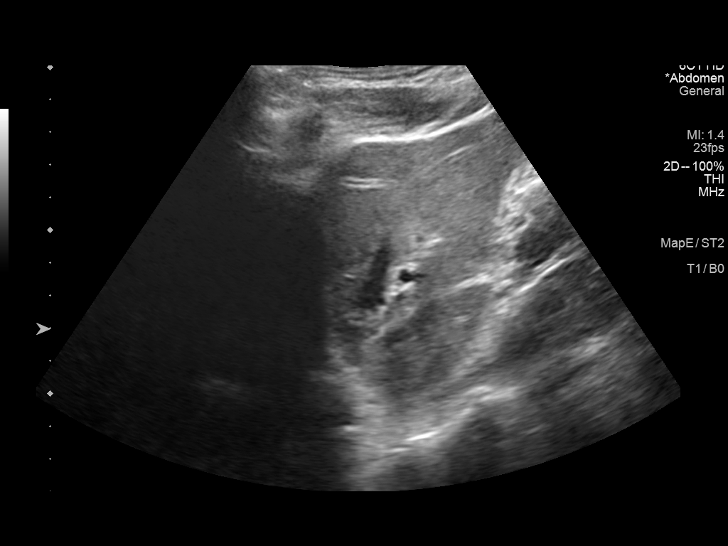
[im 18/34]
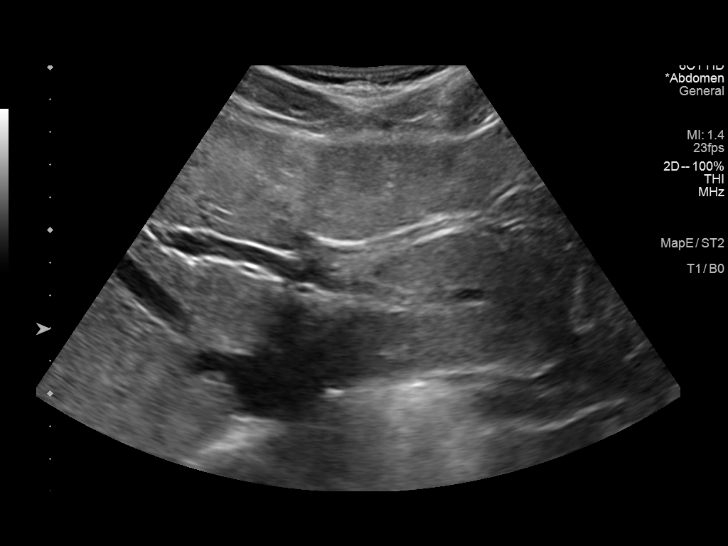
[im 21/34]
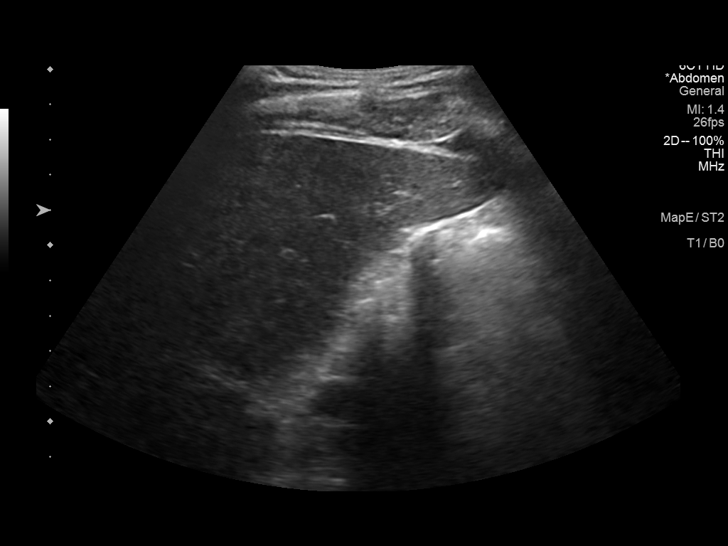
[im 23/34]
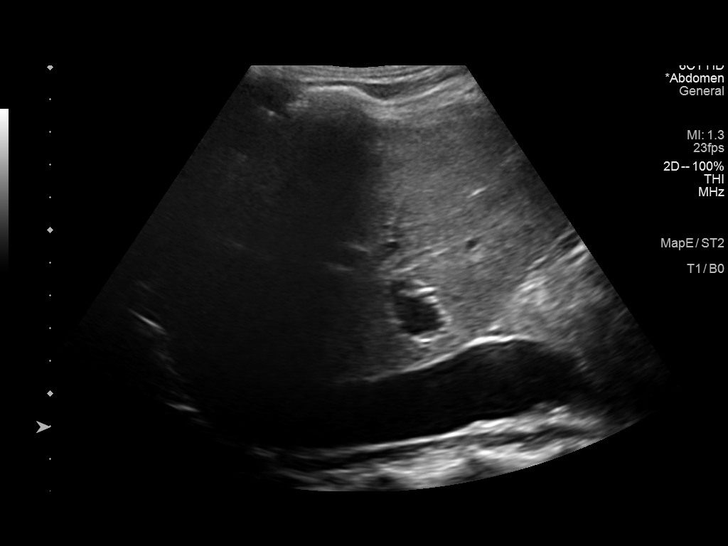
[im 25/34]
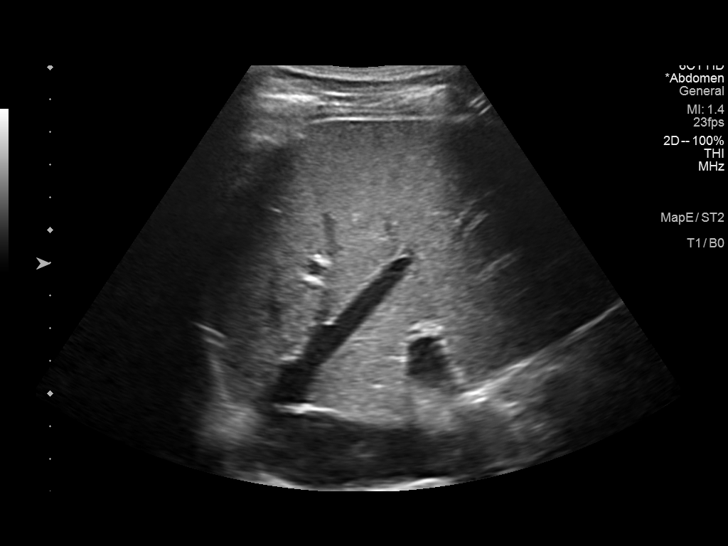
[im 28/34]
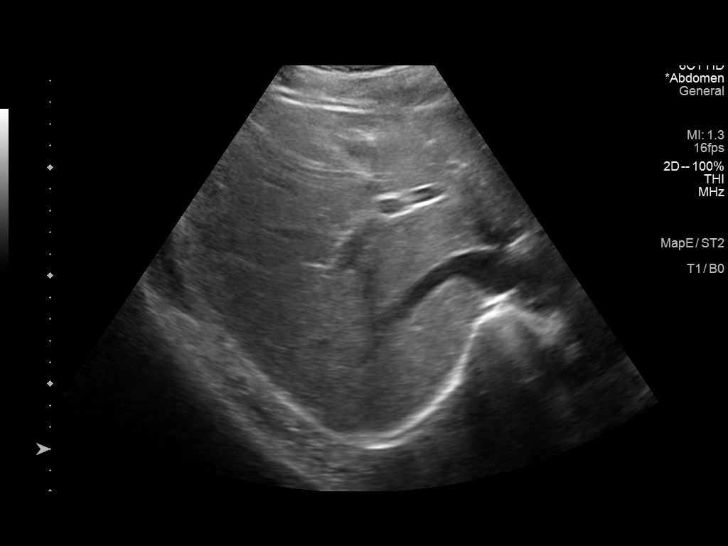
[im 31/34]
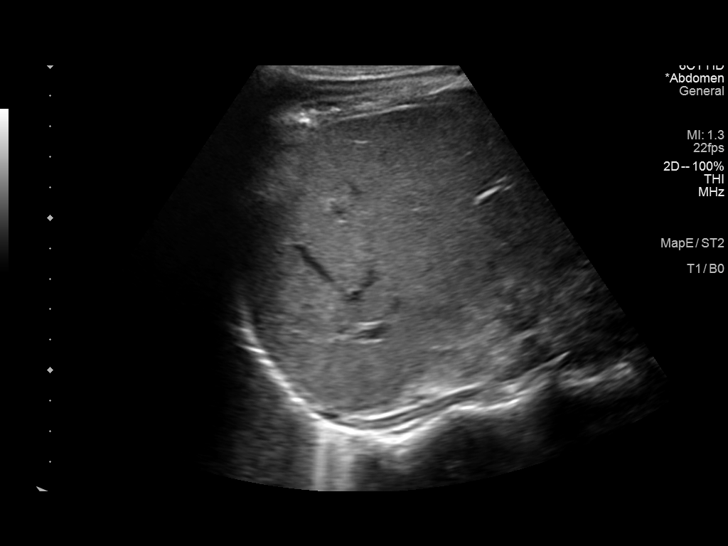
[im 34/34]
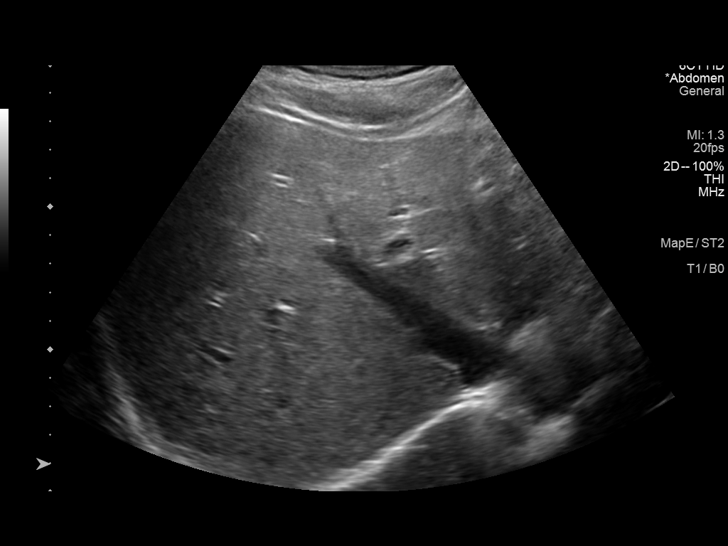

[14 of 25 positions shown; findings below may reference images not displayed]

FINDINGS: Gallbladder:

Normally distended without stones or wall thickening. No
pericholecystic fluid or sonographic Murphy sign.

Common bile duct:

Diameter: 2 mm, normal

Liver:

Normal echogenicity without mass or nodularity. No intrahepatic
biliary dilatation. Portal vein is patent on color Doppler imaging
with normal direction of blood flow towards the liver.

Other: No at upper quadrant free fluid.
IMPRESSION: Normal exam.

## 2022-04-12 IMAGING — US US ABDOMEN LIMITED
1 series · 14 of 25 positions shown · non-contrast
Comparison: Prior ultrasound from 08/18/2019

CLINICAL DATA: Initial evaluation for HCC screening. History of
chronic a.

EXAM:
ULTRASOUND ABDOMEN LIMITED RIGHT UPPER QUADRANT

[Series 1: us abdomen limited · 0.23mm/px · 14 of 36 slices shown]
[im 1/36]
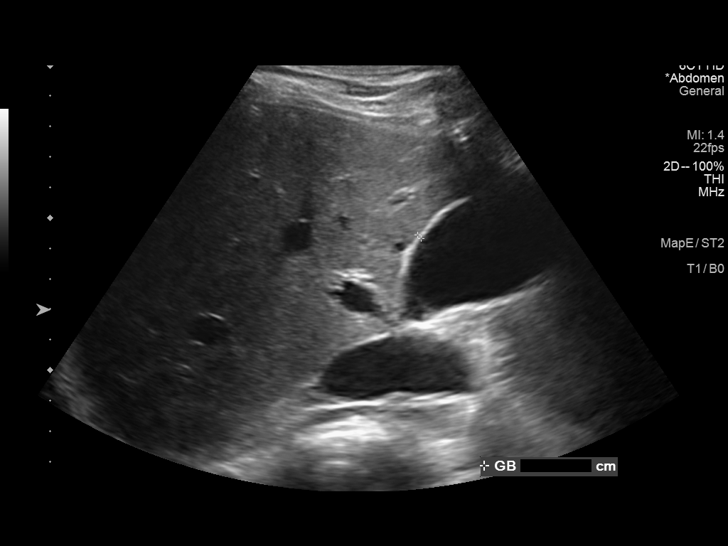
[im 3/36]
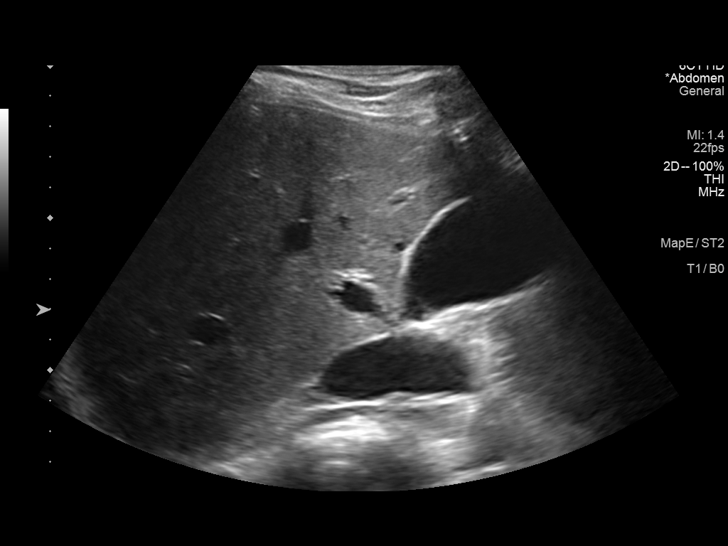
[im 6/36]
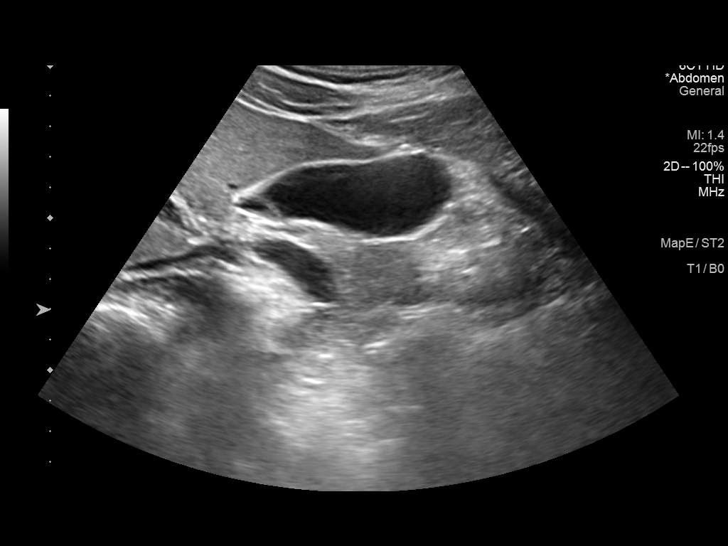
[im 9/36]
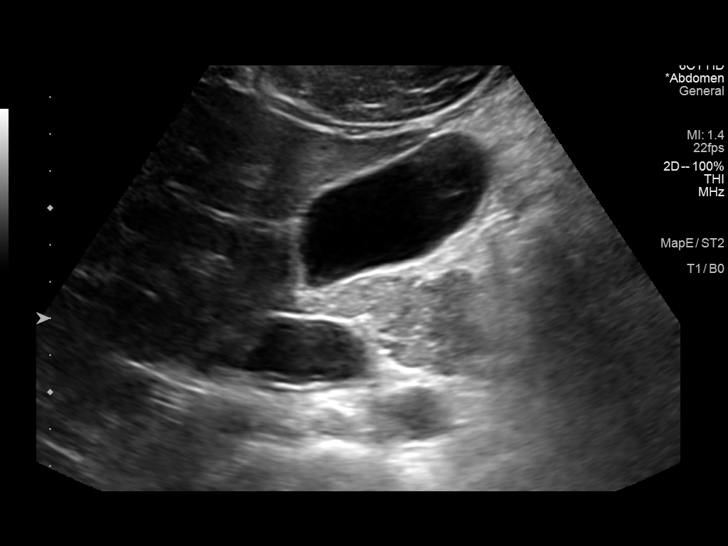
[im 12/36]
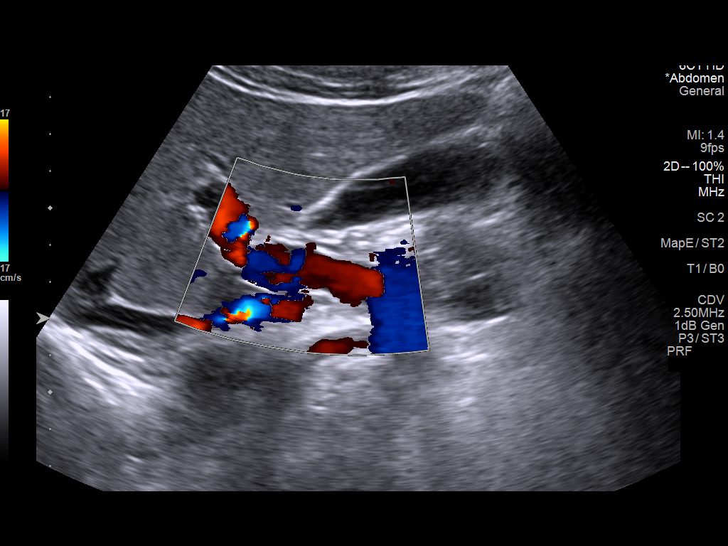
[im 14/36]
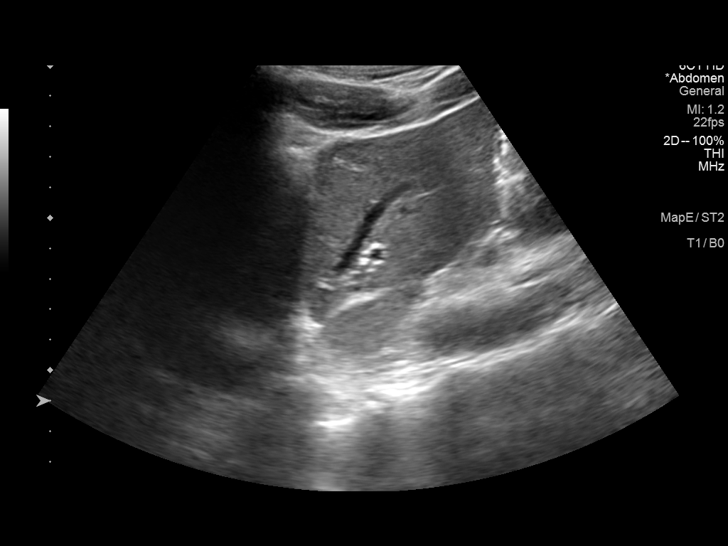
[im 17/36]
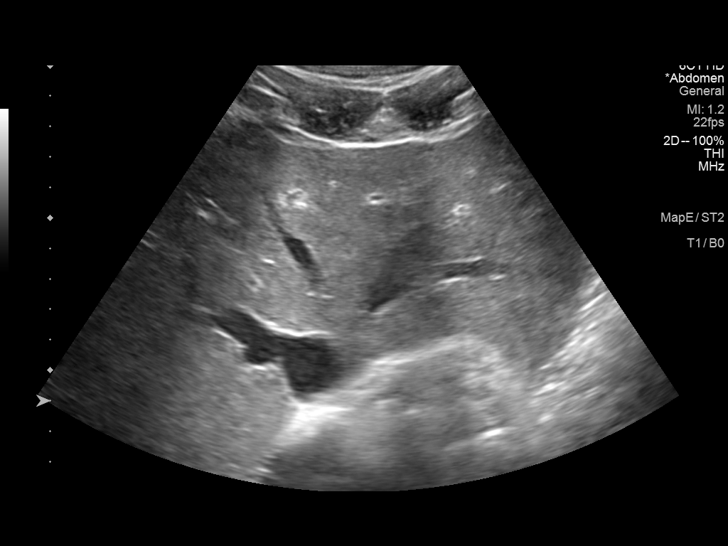
[im 19/36]
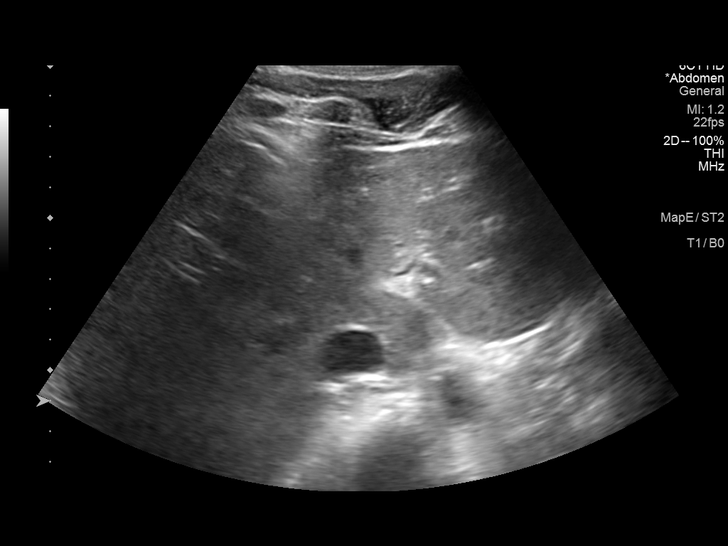
[im 22/36]
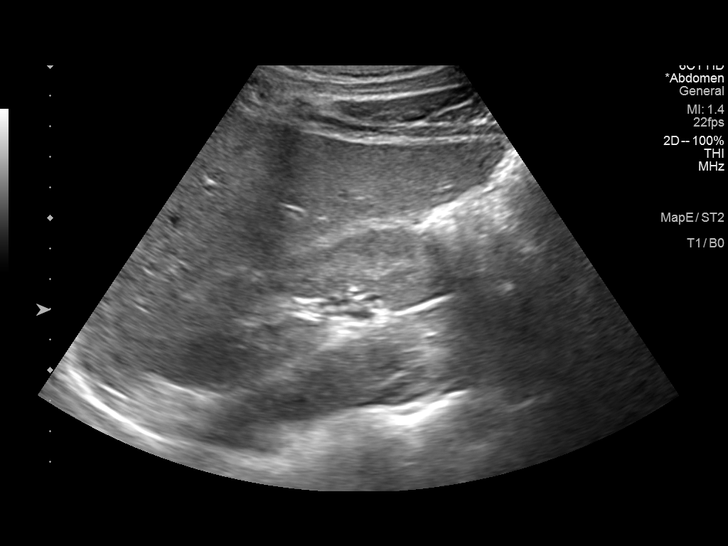
[im 24/36]
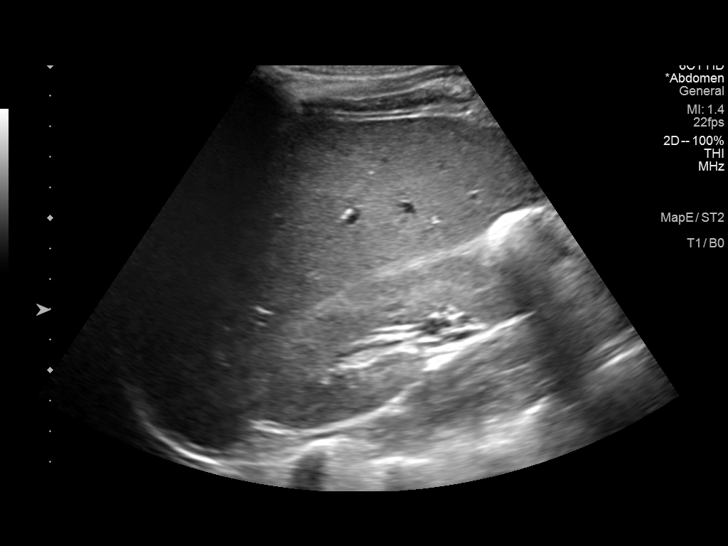
[im 27/36]
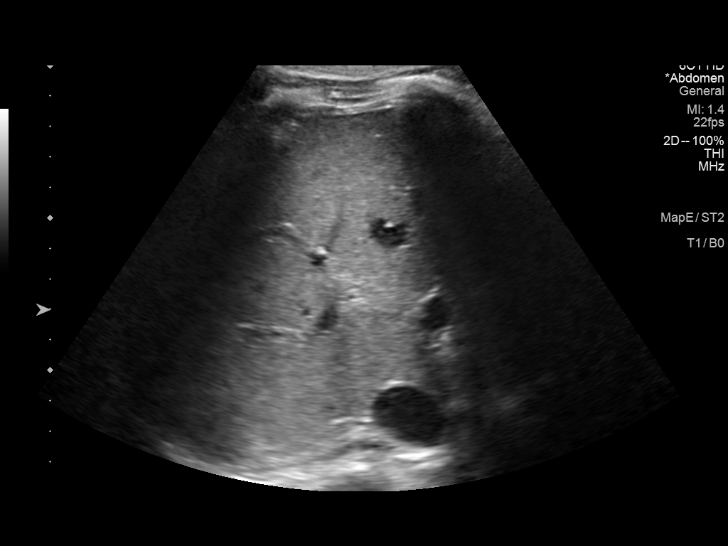
[im 30/36]
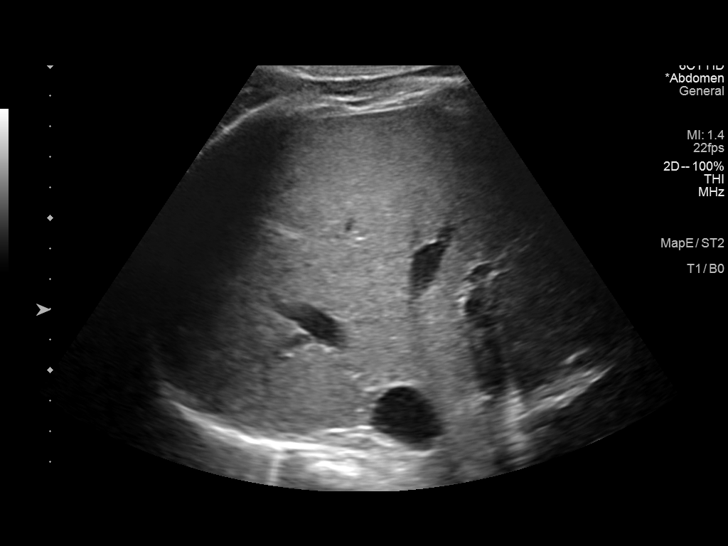
[im 33/36]
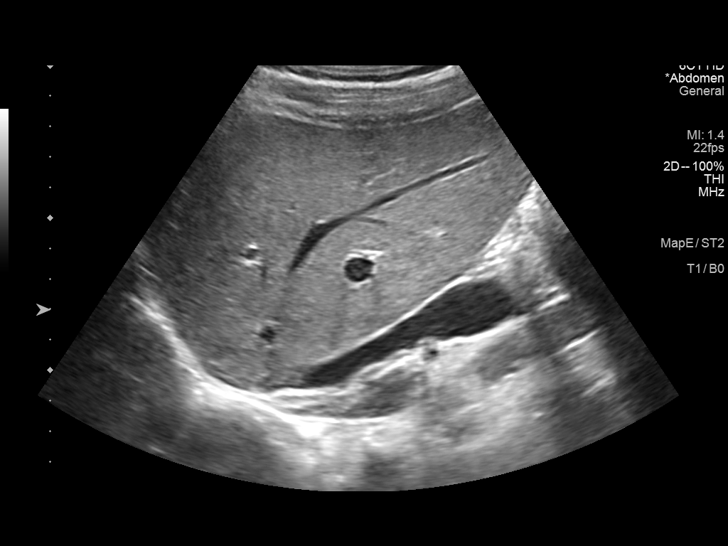
[im 36/36]
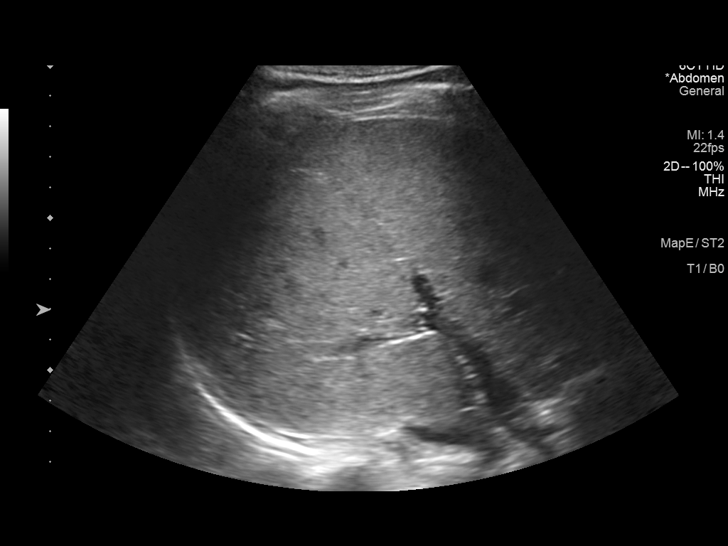

[14 of 25 positions shown; findings below may reference images not displayed]

FINDINGS: Gallbladder:

No gallstones or wall thickening visualized. No sonographic Murphy
sign noted by sonographer.

Common bile duct:

Diameter: 1.5 mm

Liver:

No focal lesion identified. Within normal limits in parenchymal
echogenicity. No nodularity of the hepatic contour. Portal vein is
patent on color Doppler imaging with normal direction of blood flow
towards the liver.

Other: No ascites within the visualized right upper quadrant.
IMPRESSION: Stable and normal right upper quadrant ultrasound.

## 2022-11-02 IMAGING — US US ABDOMEN LIMITED RUQ/ASCITES
1 series · 14 of 25 positions shown · non-contrast
Comparison: Ultrasound abdomen limited 02/08/2020

CLINICAL DATA: Hepatitis B

HCC screening
EXAM:
ULTRASOUND ABDOMEN LIMITED RIGHT UPPER QUADRANT

[Series 1: us abdomen limited ruq/ascites · 0.15mm/px · 14 of 47 slices shown]
[im 1/47]
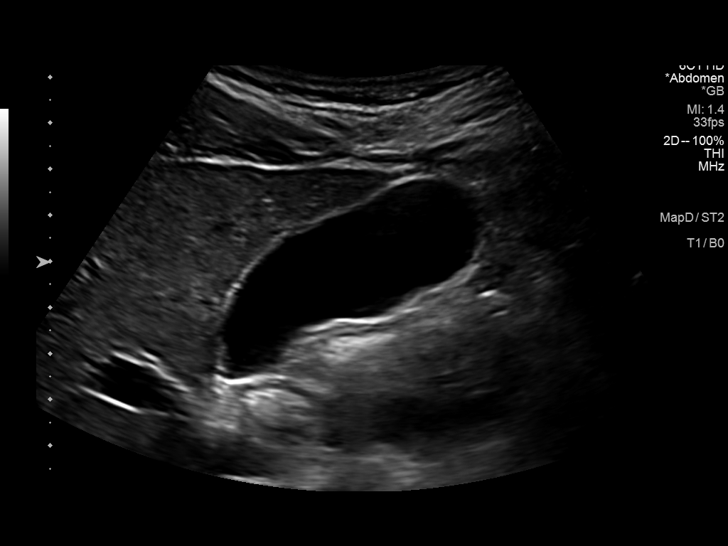
[im 4/47]
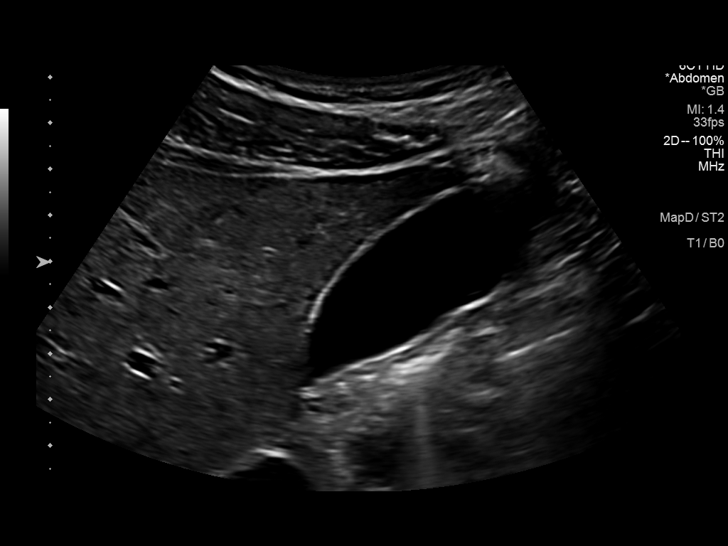
[im 8/47]
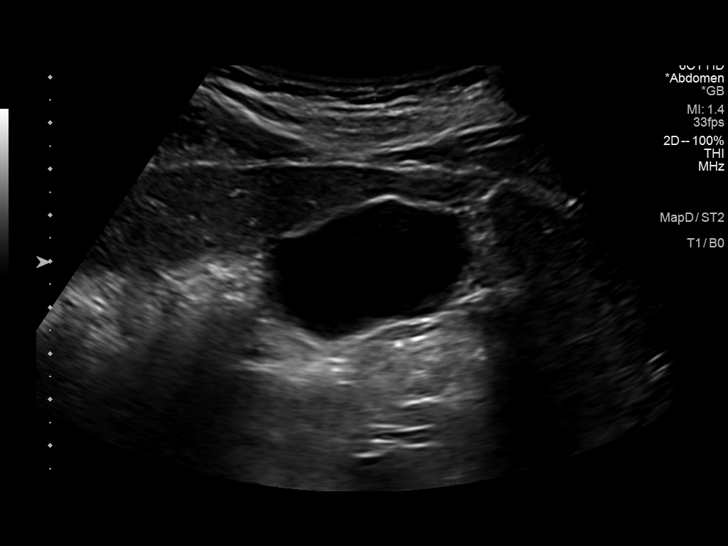
[im 12/47]
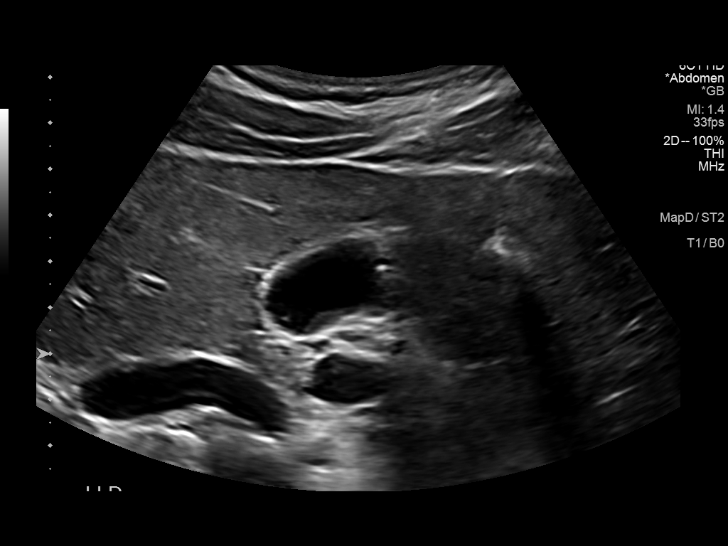
[im 16/47]
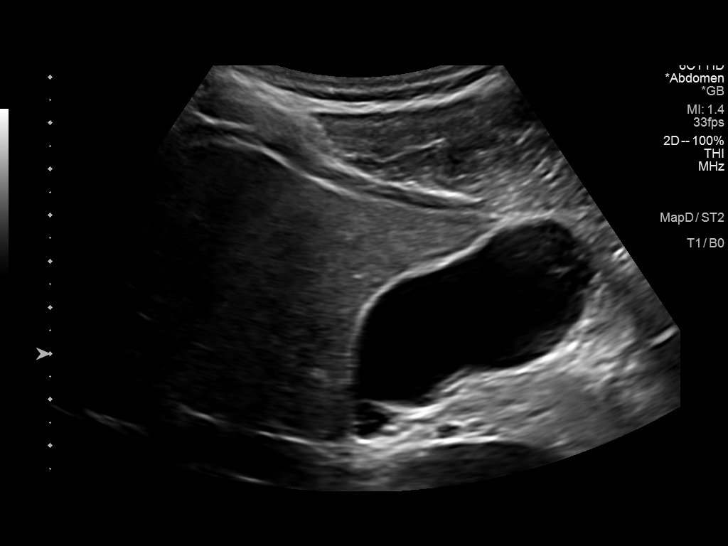
[im 18/47]
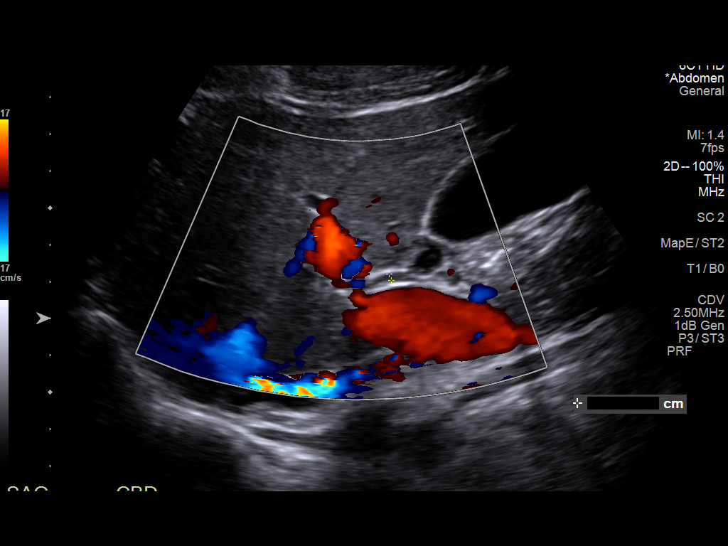
[im 22/47]
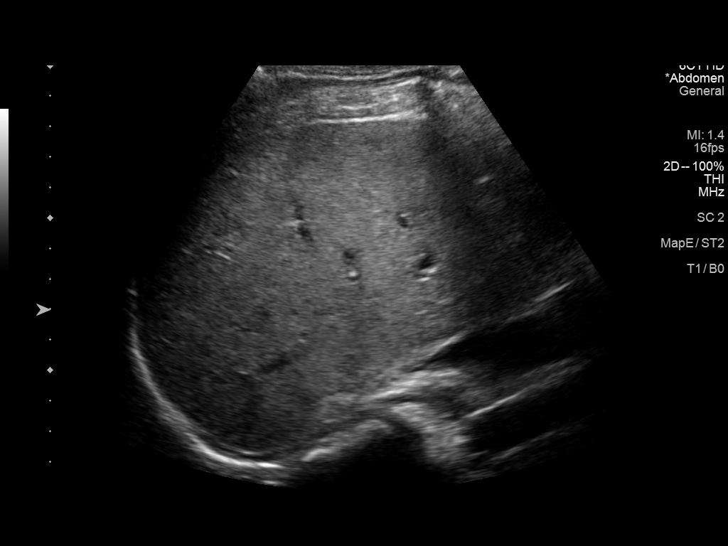
[im 25/47]
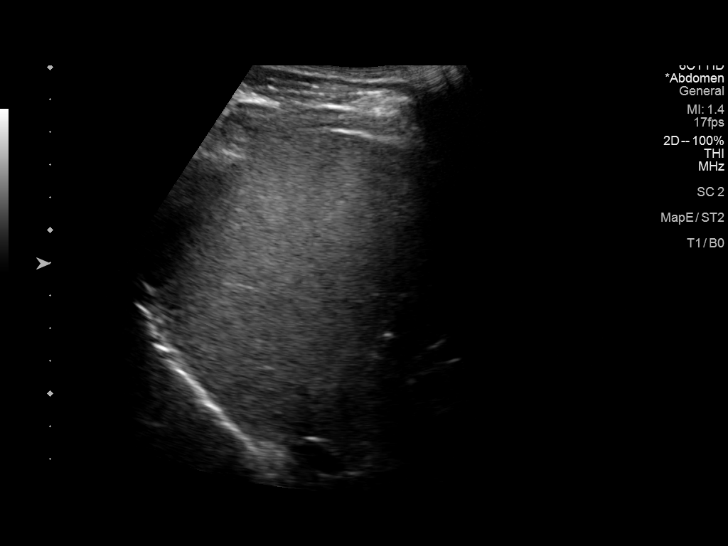
[im 29/47]
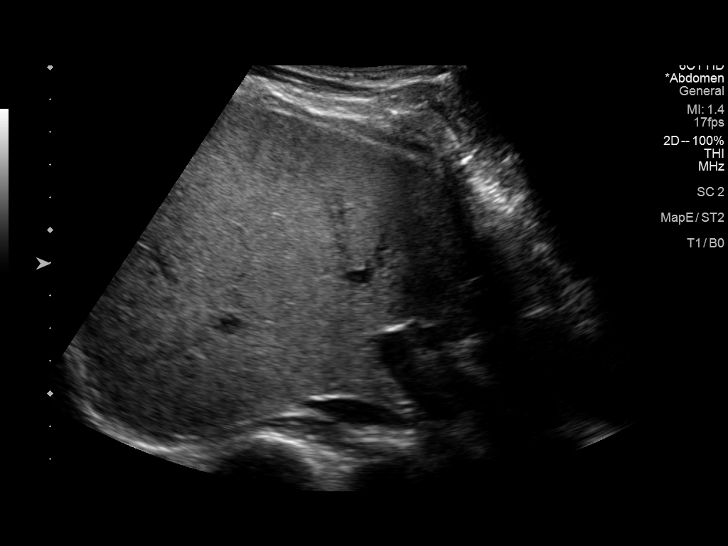
[im 31/47]
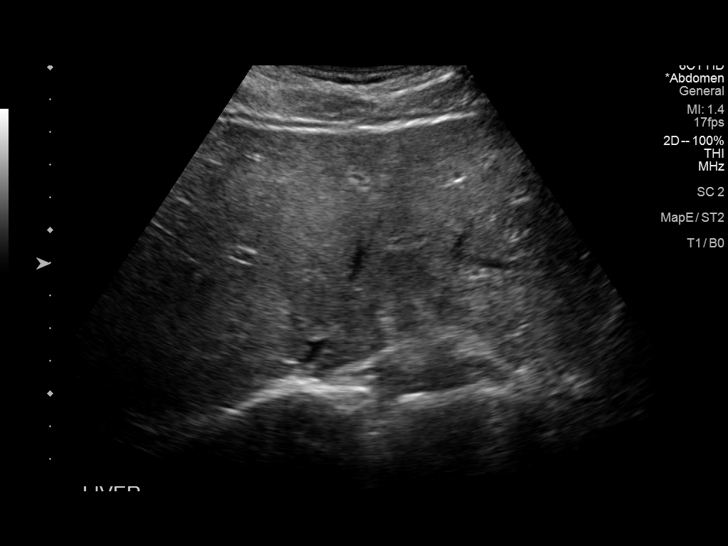
[im 35/47]
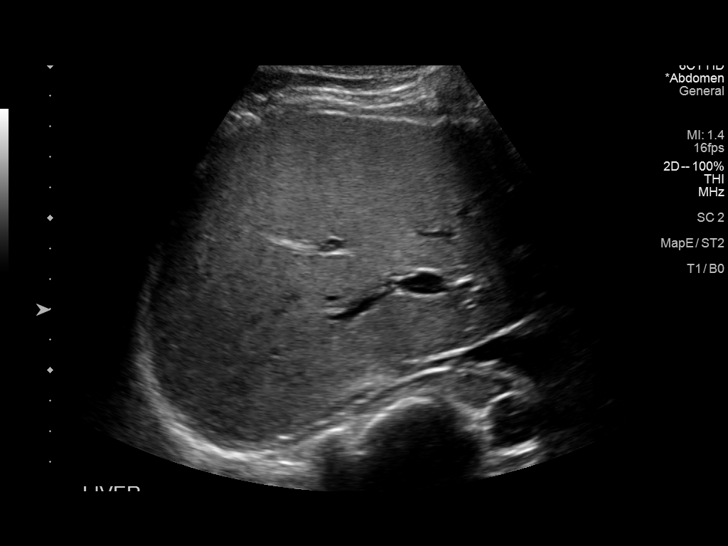
[im 39/47]
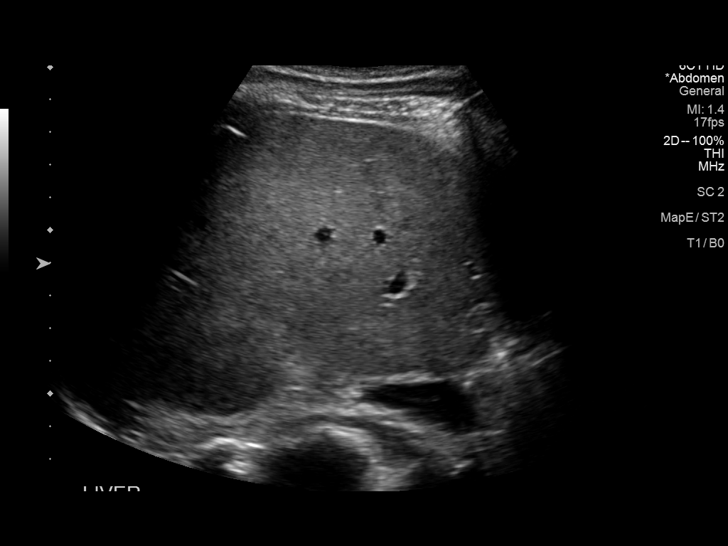
[im 43/47]
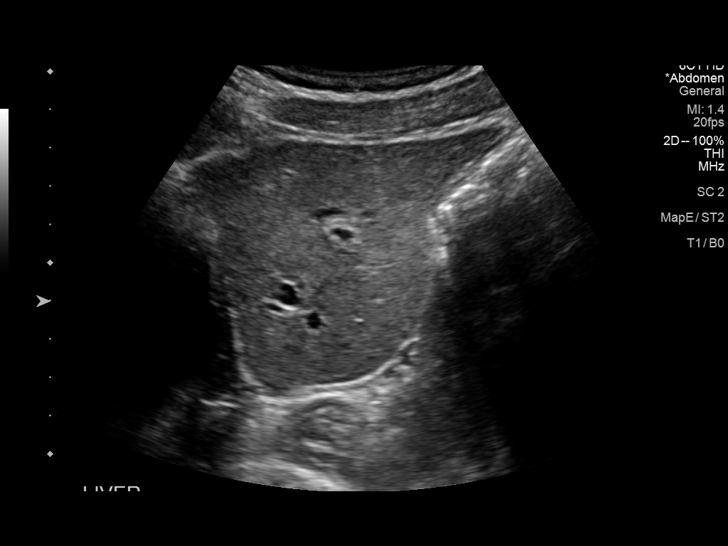
[im 47/47]
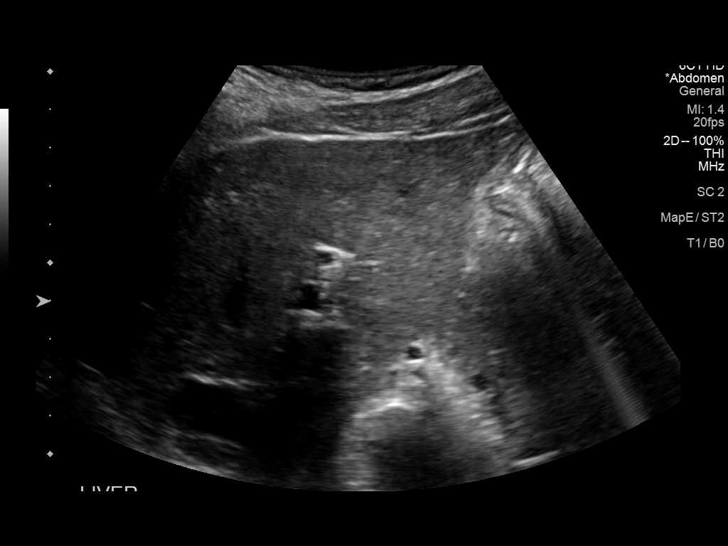

[14 of 25 positions shown; findings below may reference images not displayed]

FINDINGS: Gallbladder:

No gallstones or wall thickening visualized. No sonographic Murphy
sign noted by sonographer.

Common bile duct:

Diameter: 2 mm

Liver:

No focal lesion identified. Within normal limits in parenchymal
echogenicity. Portal vein is patent on color Doppler imaging with
normal direction of blood flow towards the liver.

Other: None.
IMPRESSION: No significant sonographic abnormality of the liver or gallbladder
